# Patient Record
Sex: Female | Born: 1970 | Race: Asian | Hispanic: No | Marital: Married | Smoking: Never smoker
Health system: Southern US, Community
[De-identification: ages and names within clinical notes are randomized; demographics above are authoritative.]

## PROBLEM LIST (undated history)

## (undated) ENCOUNTER — Inpatient Hospital Stay (HOSPITAL_COMMUNITY): Payer: Self-pay

## (undated) DIAGNOSIS — Z789 Other specified health status: Secondary | ICD-10-CM

## (undated) HISTORY — PX: NO PAST SURGERIES: SHX2092

---

## 2004-11-23 ENCOUNTER — Inpatient Hospital Stay (HOSPITAL_COMMUNITY): Admission: AD | Admit: 2004-11-23 | Discharge: 2004-11-23 | Payer: Self-pay | Admitting: Obstetrics

## 2004-11-23 ENCOUNTER — Encounter (INDEPENDENT_AMBULATORY_CARE_PROVIDER_SITE_OTHER): Payer: Self-pay | Admitting: Specialist

## 2005-03-08 ENCOUNTER — Ambulatory Visit (HOSPITAL_COMMUNITY): Admission: RE | Admit: 2005-03-08 | Discharge: 2005-03-08 | Payer: Self-pay | Admitting: Obstetrics

## 2005-03-09 ENCOUNTER — Ambulatory Visit (HOSPITAL_COMMUNITY): Admission: RE | Admit: 2005-03-09 | Discharge: 2005-03-09 | Payer: Self-pay | Admitting: Obstetrics

## 2005-03-09 ENCOUNTER — Encounter (INDEPENDENT_AMBULATORY_CARE_PROVIDER_SITE_OTHER): Payer: Self-pay | Admitting: *Deleted

## 2005-03-28 ENCOUNTER — Ambulatory Visit (HOSPITAL_COMMUNITY): Admission: RE | Admit: 2005-03-28 | Discharge: 2005-03-28 | Payer: Self-pay | Admitting: Obstetrics

## 2005-05-07 ENCOUNTER — Inpatient Hospital Stay (HOSPITAL_COMMUNITY): Admission: AD | Admit: 2005-05-07 | Discharge: 2005-05-07 | Payer: Self-pay | Admitting: Obstetrics

## 2006-01-15 ENCOUNTER — Inpatient Hospital Stay (HOSPITAL_COMMUNITY): Admission: AD | Admit: 2006-01-15 | Discharge: 2006-01-17 | Payer: Self-pay | Admitting: Obstetrics

## 2007-02-11 ENCOUNTER — Ambulatory Visit: Payer: Self-pay | Admitting: Family Medicine

## 2007-02-11 LAB — CONVERTED CEMR LAB
Basophils Absolute: 0.1 10*3/uL (ref 0.0–0.1)
Basophils Relative: 0.9 % (ref 0.0–1.0)
Glucose, Bld: 108 mg/dL — ABNORMAL HIGH (ref 70–99)
LDL Cholesterol: 100 mg/dL — ABNORMAL HIGH (ref 0–99)
Lymphocytes Relative: 26.5 % (ref 12.0–46.0)
MCHC: 33.4 g/dL (ref 30.0–36.0)
Monocytes Absolute: 0.6 10*3/uL (ref 0.2–0.7)
Neutro Abs: 6.8 10*3/uL (ref 1.4–7.7)
RBC: 4.69 M/uL (ref 3.87–5.11)
RDW: 12.1 % (ref 11.5–14.6)
Triglycerides: 139 mg/dL (ref 0–149)
WBC: 10.4 10*3/uL (ref 4.5–10.5)

## 2007-08-21 ENCOUNTER — Emergency Department (HOSPITAL_COMMUNITY): Admission: EM | Admit: 2007-08-21 | Discharge: 2007-08-21 | Payer: Self-pay | Admitting: Emergency Medicine

## 2010-11-13 ENCOUNTER — Encounter: Payer: Self-pay | Admitting: Obstetrics

## 2011-03-10 NOTE — Op Note (Signed)
NAMEJILIAN, WEST NO.:  [id number]   MEDICAL RECORD NO.:  [id number]           PATIENT TYPE:   LOCATION:                                 FACILITY:   PHYSICIAN:  Kathreen Cosier, M.D.   DATE OF BIRTH:   DATE OF PROCEDURE:  03/09/2005  DATE OF DISCHARGE:                                 OPERATIVE REPORT   PREOPERATIVE DIAGNOSIS:  Blighted ovum.   Using MAC, patient in lithotomy position, perineum and vagina were prepped  and draped, bladder emptied with a straight catheter.  Bimanual exam  revealed the uterus to be eight week size.  A weighted speculum placed in  the vagina, anterior lip of the cervix grasped with a tenaculum.  Xylocaine  1% 9 mL injected at 3 and 9 o'clock.  The cervix was open.  The cavity was  sounded at 10 cm.  The cervix easily admitted a #9 suction, which was used  to aspirate the uterine contents.  The procedure was done until the cavity  was cleaned.  The patient tolerated the procedure well, taken to the  recovery room in good condition.      BAM/MEDQ  D:  03/09/2005  T:  03/09/2005  Job:  [id number]

## 2011-05-24 ENCOUNTER — Other Ambulatory Visit (HOSPITAL_COMMUNITY): Payer: Self-pay | Admitting: Obstetrics

## 2011-05-24 DIAGNOSIS — Z1231 Encounter for screening mammogram for malignant neoplasm of breast: Secondary | ICD-10-CM

## 2011-06-22 ENCOUNTER — Ambulatory Visit (HOSPITAL_COMMUNITY)
Admission: RE | Admit: 2011-06-22 | Discharge: 2011-06-22 | Disposition: A | Discharge status: Home / Self Care | Payer: 59 | Source: Ambulatory Visit | Attending: Obstetrics | Admitting: Obstetrics

## 2011-06-22 DIAGNOSIS — Z1231 Encounter for screening mammogram for malignant neoplasm of breast: Secondary | ICD-10-CM | POA: Insufficient documentation

## 2011-06-30 ENCOUNTER — Other Ambulatory Visit: Payer: Self-pay | Admitting: Obstetrics

## 2011-06-30 DIAGNOSIS — R928 Other abnormal and inconclusive findings on diagnostic imaging of breast: Secondary | ICD-10-CM

## 2011-07-10 ENCOUNTER — Ambulatory Visit
Admission: RE | Admit: 2011-07-10 | Discharge: 2011-07-10 | Disposition: A | Discharge status: Home / Self Care | Payer: 59 | Source: Ambulatory Visit | Attending: Obstetrics | Admitting: Obstetrics

## 2011-07-10 ENCOUNTER — Other Ambulatory Visit: Payer: Self-pay | Admitting: Obstetrics

## 2011-07-10 DIAGNOSIS — R928 Other abnormal and inconclusive findings on diagnostic imaging of breast: Secondary | ICD-10-CM

## 2011-07-17 ENCOUNTER — Ambulatory Visit
Admission: RE | Admit: 2011-07-17 | Discharge: 2011-07-17 | Disposition: A | Discharge status: Home / Self Care | Payer: 59 | Source: Ambulatory Visit | Attending: Obstetrics | Admitting: Obstetrics

## 2011-07-17 DIAGNOSIS — R928 Other abnormal and inconclusive findings on diagnostic imaging of breast: Secondary | ICD-10-CM

## 2012-05-13 ENCOUNTER — Other Ambulatory Visit: Payer: Self-pay | Admitting: Obstetrics

## 2012-05-13 DIAGNOSIS — N63 Unspecified lump in unspecified breast: Secondary | ICD-10-CM

## 2012-05-28 ENCOUNTER — Ambulatory Visit
Admission: RE | Admit: 2012-05-28 | Discharge: 2012-05-28 | Disposition: A | Discharge status: Home / Self Care | Payer: 59 | Source: Ambulatory Visit | Attending: Obstetrics | Admitting: Obstetrics

## 2012-05-28 DIAGNOSIS — N63 Unspecified lump in unspecified breast: Secondary | ICD-10-CM

## 2013-06-26 ENCOUNTER — Encounter (HOSPITAL_COMMUNITY): Payer: Self-pay | Admitting: Emergency Medicine

## 2013-06-26 ENCOUNTER — Emergency Department (HOSPITAL_COMMUNITY)
Admission: EM | Admit: 2013-06-26 | Discharge: 2013-06-26 | Disposition: A | Discharge status: Home / Self Care | Payer: 59 | Source: Home / Self Care

## 2013-06-26 DIAGNOSIS — I889 Nonspecific lymphadenitis, unspecified: Secondary | ICD-10-CM

## 2013-06-26 MED ORDER — AMOXICILLIN-POT CLAVULANATE 875-125 MG PO TABS: 1.0000 | ORAL_TABLET | Freq: Two times a day (BID) | ORAL | Status: DC

## 2013-06-26 NOTE — ED Notes (Signed)
Pt c/o swollen lymph node onset Tuesday Sxs include: pain when she eats Denies: fevers, cold sxs  Alert w/no signs of acute distress.

## 2013-06-26 NOTE — Discharge Instructions (Signed)
Cervical Adenitis °You have a swollen lymph gland in your neck. This commonly happens with Strep and virus infections, dental problems, insect bites, and injuries about the face, scalp, or neck. The lymph glands swell as the body fights the infection or heals the injury. Swelling and firmness typically lasts for several weeks after the infection or injury is healed. Rarely lymph glands can become swollen because of cancer or TB. °Antibiotics are prescribed if there is evidence of an infection. Sometimes an infected lymph gland becomes filled with pus. This condition may require opening up the abscessed gland by draining it surgically. Most of the time infected glands return to normal within two weeks. Do not poke or squeeze the swollen lymph nodes. That may keep them from shrinking back to their normal size. If the lymph gland is still swollen after 2 weeks, further medical evaluation is needed.  °SEEK IMMEDIATE MEDICAL CARE IF:  °You have difficulty swallowing or breathing, increased swelling, severe pain, or a high fever.  °Document Released: 10/09/2005 Document Revised: 01/01/2012 Document Reviewed: 03/31/2007 °ExitCare® Patient Information ©2014 [employer]. ° °Swollen Lymph Nodes °The lymphatic system filters fluid from around cells. It is like a system of blood vessels. These channels carry lymph instead of blood. The lymphatic system is an important part of the immune (disease fighting) system. When people talk about "swollen glands in the neck," they are usually talking about swollen lymph nodes. The lymph nodes are like the little traps for infection. You and your caregiver may be able to feel lymph nodes, especially swollen nodes, in these common areas: the groin (inguinal area), armpits (axilla), and above the clavicle (supraclavicular). You may also feel them in the neck (cervical) and the back of the head just above the hairline (occipital). °Swollen glands occur when there is any condition in which  the body responds with an allergic type of reaction. For instance, the glands in the neck can become swollen from insect bites or any type of minor infection on the head. These are very noticeable in children with only minor problems. Lymph nodes may also become swollen when there is a tumor or problem with the lymphatic system, such as Hodgkin's disease. °TREATMENT  °· Most swollen glands do not require treatment. They can be observed (watched) for a short period of time, if your caregiver feels it is necessary. Most of the time, observation is not necessary. °· Antibiotics (medicines that kill germs) may be prescribed by your caregiver. Your caregiver may prescribe these if he or she feels the swollen glands are due to a bacterial (germ) infection. Antibiotics are not used if the swollen glands are caused by a virus. °HOME CARE INSTRUCTIONS  °· Take medications as directed by your caregiver. Only take over-the-counter or prescription medicines for pain, discomfort, or fever as directed by your caregiver. °SEEK MEDICAL CARE IF:  °· If you begin to run a temperature greater than 102° F (38.9° C), or as your caregiver suggests. °MAKE SURE YOU:  °· Understand these instructions. °· Will watch your condition. °· Will get help right away if you are not doing well or get worse. °Document Released: 09/29/2002 Document Revised: 01/01/2012 Document Reviewed: 10/09/2005 °ExitCare® Patient Information ©2014 [employer]. ° °

## 2013-06-26 NOTE — ED Provider Notes (Signed)
CSN: [id number]     Arrival date & time 06/26/13  0831 History   None    Chief Complaint  Patient presents with  . Lymphadenopathy   (Consider location/radiation/quality/duration/timing/severity/associated sxs/prior Treatment) HPI Comments: Healthy 42 year old female presents with a swelling nodule to her right anterior neck. It began about 48 hours ago it started out as a small nodule of approximately 1 cm. For the past 48 hours it has continued to enlarge. Patient denies local or systemic symptoms. Described mild pain over the nodule when opening mouth widely. Denies problems with swallowing.   History reviewed. No pertinent past medical history. History reviewed. No pertinent past surgical history. No family history on file. History  Substance Use Topics  . Smoking status: Never Smoker   . Smokeless tobacco: Not on file  . Alcohol Use: No   OB History   Grav Para Term Preterm Abortions TAB SAB Ect Mult Living                 Review of Systems  Constitutional: Negative for fever, chills, activity change, appetite change, fatigue and unexpected weight change.  HENT: Negative for ear pain, congestion, sore throat, facial swelling, rhinorrhea, neck pain, postnasal drip, sinus pressure, tinnitus and ear discharge.   Respiratory: Negative.   Cardiovascular: Negative.   Gastrointestinal: Negative.   Neurological: Negative.   Psychiatric/Behavioral: Negative.     Allergies  Review of patient's allergies indicates no known allergies.  Home Medications   Current Outpatient Rx  Name  Route  Sig  Dispense  Refill  . amoxicillin-clavulanate (AUGMENTIN) 875-125 MG per tablet   Oral   Take 1 tablet by mouth every 12 (twelve) hours.   14 tablet   0    BP 100/68  Pulse 81  Temp(Src) 98.5 F (36.9 C) (Oral)  Resp 16  SpO2 98%  LMP 06/25/2013 Physical Exam  Vitals reviewed. Constitutional: She is oriented to person, place, and time. She appears well-developed and  well-nourished. No distress.  Patient is sitting on the table with relaxed posturing, smiling, speaking in complete sentences and in no  distress. She continues to deny constitutional symptoms or feeling ill in any way.  HENT:  Right Ear: External ear normal.  Left Ear: External ear normal.  Oropharynx with minor erythema and one solitary white patch to the left tonsil. This could represent a food particle. No other signs of exudate or intraoral swelling. No dental tenderness. No buccal or tongue or other intraoral lesions. No swelling within the mouth. No tenderness to palpation. Swallow reflex is normal she is able to open her mouth widely and normally and move her mandible side to side normally.  Eyes: Conjunctivae and EOM are normal.  Neck: Normal range of motion. Neck supple.  There is a solitary right cervical enlarge lymph node immediately inferior to the mandible. Mildly tender and slightly mobile. No overlying discoloration. No swelling. The lesion is well marginated and measures 3 x 2 cm. No other lymph nodes are palpable or observed.  Cardiovascular: Normal rate, regular rhythm and normal heart sounds.   Pulmonary/Chest: Effort normal and breath sounds normal. No respiratory distress. She has no wheezes.  Musculoskeletal: She exhibits no edema.  Lymphadenopathy:    She has cervical adenopathy.  Neurological: She is alert and oriented to person, place, and time.  Skin: Skin is warm and dry.  Psychiatric: She has a normal mood and affect.    ED Course  Procedures (including critical care time) Labs Review Labs Reviewed  POCT RAPID STREP A ([facility] CARE ONLY)   Imaging Review No results found.  MDM   1. Cervical lymphadenitis        Augmentin 875 mg twice a day for 7 days. For discomfort may take Tylenol.  If the lymph node is getting worse, larger developing erythema or additional enlarged tender nodes seek medical attention promptly. Apply warm compresses. Am unable to  locate the source for the enlarged lymph node. No localized infection is appreciated  Unless there is a subtle occult infection of the pharynx. Rapid strep is negative will be sent down for culture.  Hayden Rasmussen, NP 06/26/13 [phone]  Medical screening examination/treatment/procedure(s) were performed by a resident physician or non-physician practitioner and as the supervising physician I was immediately available for consultation/collaboration.  Clementeen Graham, MD   Rodolph Bong, MD 06/26/13 [phone]

## 2013-06-28 LAB — CULTURE, GROUP A STREP

## 2013-07-18 ENCOUNTER — Other Ambulatory Visit: Payer: Self-pay | Admitting: Obstetrics

## 2013-07-18 DIAGNOSIS — D242 Benign neoplasm of left breast: Secondary | ICD-10-CM

## 2013-08-13 ENCOUNTER — Ambulatory Visit
Admission: RE | Admit: 2013-08-13 | Discharge: 2013-08-13 | Disposition: A | Discharge status: Home / Self Care | Payer: PRIVATE HEALTH INSURANCE | Source: Ambulatory Visit | Attending: Obstetrics | Admitting: Obstetrics

## 2013-08-13 DIAGNOSIS — D242 Benign neoplasm of left breast: Secondary | ICD-10-CM

## 2014-05-20 LAB — OB RESULTS CONSOLE ANTIBODY SCREEN: Antibody Screen: NEGATIVE

## 2014-05-20 LAB — OB RESULTS CONSOLE GC/CHLAMYDIA
Chlamydia: NEGATIVE
Gonorrhea: NEGATIVE

## 2014-05-20 LAB — OB RESULTS CONSOLE HIV ANTIBODY (ROUTINE TESTING): HIV: NONREACTIVE

## 2014-05-20 LAB — OB RESULTS CONSOLE RPR: RPR: NONREACTIVE

## 2014-05-20 LAB — OB RESULTS CONSOLE HEPATITIS B SURFACE ANTIGEN: Hepatitis B Surface Ag: NEGATIVE

## 2014-05-20 LAB — OB RESULTS CONSOLE RUBELLA ANTIBODY, IGM: Rubella: IMMUNE

## 2014-05-20 LAB — OB RESULTS CONSOLE ABO/RH: RH Type: POSITIVE

## 2014-06-30 ENCOUNTER — Other Ambulatory Visit (HOSPITAL_COMMUNITY): Payer: Self-pay | Admitting: Obstetrics

## 2014-06-30 DIAGNOSIS — O09529 Supervision of elderly multigravida, unspecified trimester: Secondary | ICD-10-CM

## 2014-07-22 ENCOUNTER — Encounter (HOSPITAL_COMMUNITY): Payer: Self-pay

## 2014-07-22 ENCOUNTER — Ambulatory Visit (HOSPITAL_COMMUNITY)
Admission: RE | Admit: 2014-07-22 | Discharge: 2014-07-22 | Disposition: A | Discharge status: Home / Self Care | Payer: 59 | Source: Ambulatory Visit | Attending: Obstetrics | Admitting: Obstetrics

## 2014-07-22 ENCOUNTER — Ambulatory Visit (HOSPITAL_COMMUNITY)
Admission: RE | Admit: 2014-07-22 | Discharge: 2014-07-22 | Disposition: A | Discharge status: Home / Self Care | Payer: Self-pay | Source: Ambulatory Visit | Attending: Obstetrics | Admitting: Obstetrics

## 2014-07-22 DIAGNOSIS — O09529 Supervision of elderly multigravida, unspecified trimester: Secondary | ICD-10-CM | POA: Insufficient documentation

## 2014-07-22 DIAGNOSIS — O09522 Supervision of elderly multigravida, second trimester: Secondary | ICD-10-CM

## 2014-07-22 DIAGNOSIS — IMO0002 Reserved for concepts with insufficient information to code with codable children: Secondary | ICD-10-CM | POA: Insufficient documentation

## 2014-07-22 NOTE — Progress Notes (Signed)
Appointment Date: 07/22/2014 DOB: 01-Feb-1971 Referring Provider: Frederico Hamman, MD Attending: Dr. Benjaman Lobe  Elizabeth Hamilton and her husband, Elizabeth Hamilton, were seen for genetic counseling because of a maternal age of 43 y.o.Marland Kitchen     They were counseled regarding maternal age and the association with risk for chromosome conditions due to nondisjunction.   We reviewed chromosomes, nondisjunction, and the associated 1 in 16 risk for fetal aneuploidy related to a maternal age of 42 y.o. at [redacted]w[redacted]d gestation.  They were counseled that the risk for aneuploidy decreases as gestational age increases, accounting for those pregnancies which spontaneously abort.  We specifically discussed Down syndrome (trisomy 88), trisomies 45 and 76, and sex chromosome aneuploidies (47,XXX and 47,XXY) including the common features and prognoses of each.   We reviewed available screening options including Quad screen, noninvasive prenatal screening (NIPS)/cell free DNA (cfDNA) testing, and detailed ultrasound.  They were counseled that screening tests are used to modify a patient's a priori risk for aneuploidy, typically based on age. This estimate provides a pregnancy specific risk assessment. We reviewed the benefits and limitations of each option. Specifically, we discussed the conditions for which each test screens, the detection rates, and false positive rates of each. They were also counseled regarding diagnostic testing via amniocentesis. We reviewed the approximate 1 in 017-494 risk for complications for amniocentesis, including spontaneous pregnancy loss. After consideration of all the options, they elected to proceed with NIPS.  However, Ms. Roselli was notified during her visit that her insurance was no longer active.  Thus, she no longer had insurance coverage for the pregnancy.  For this reason, she requested to delay having NIPS until she returns for ultrasound in 3 weeks.  She will attempt to straighten out  her insurance situation, but was also given information to contact the laboratory directly to arrange for "compassionate care pricing".     A complete ultrasound was performed today. The ultrasound report will be sent under separate cover. There were no visualized fetal anomalies or markers suggestive of aneuploidy, other than choroid plexus cysts. Diagnostic testing and screening were declined today.  They understand that ultrasound cannot rule out all birth defects or genetic syndromes. The patient was advised of this limitation and states she still does not want additional testing at this time.   [name] was provided with written information regarding cystic fibrosis (CF) including the carrier frequency and incidence in the Caucasian population, the availability of carrier testing and prenatal diagnosis if indicated.  In addition, we discussed that CF is routinely screened for as part of the [facility] newborn screening panel.  She declined testing today.   Both family histories were reviewed and found to be noncontributory for birth defects, intellectual disability, and known genetic conditions. Without further information regarding the provided family history, an accurate genetic risk cannot be calculated. Further genetic counseling is warranted if more information is obtained.  [name] denied exposure to environmental toxins or chemical agents. She denied the use of alcohol, tobacco or street drugs. She denied significant viral illnesses during the course of her pregnancy. Her medical and surgical histories were noncontributory.   I counseled this couple regarding the above risks and available options.  The approximate face-to-face time with the genetic counselor was 45 minutes.  Cam Hai, MS,  Certified Genetic Counselor

## 2014-08-13 ENCOUNTER — Other Ambulatory Visit (HOSPITAL_COMMUNITY): Payer: Self-pay | Admitting: Maternal and Fetal Medicine

## 2014-08-13 DIAGNOSIS — O09292 Supervision of pregnancy with other poor reproductive or obstetric history, second trimester: Secondary | ICD-10-CM

## 2014-08-13 DIAGNOSIS — Z3689 Encounter for other specified antenatal screening: Secondary | ICD-10-CM

## 2014-08-13 DIAGNOSIS — O09522 Supervision of elderly multigravida, second trimester: Secondary | ICD-10-CM

## 2014-08-13 DIAGNOSIS — Z8632 Personal history of gestational diabetes: Secondary | ICD-10-CM

## 2014-08-18 ENCOUNTER — Encounter (HOSPITAL_COMMUNITY): Payer: Self-pay

## 2014-08-18 ENCOUNTER — Ambulatory Visit (HOSPITAL_COMMUNITY)
Admission: RE | Admit: 2014-08-18 | Discharge: 2014-08-18 | Disposition: A | Discharge status: Home / Self Care | Payer: 59 | Source: Ambulatory Visit | Attending: Obstetrics | Admitting: Obstetrics

## 2014-08-18 VITALS — BP 113/47 | HR 81 | Wt 151.0 lb

## 2014-08-18 DIAGNOSIS — Z3689 Encounter for other specified antenatal screening: Secondary | ICD-10-CM

## 2014-08-18 DIAGNOSIS — O350XX0 Maternal care for (suspected) central nervous system malformation in fetus, not applicable or unspecified: Secondary | ICD-10-CM

## 2014-08-18 DIAGNOSIS — Z3A20 20 weeks gestation of pregnancy: Secondary | ICD-10-CM | POA: Insufficient documentation

## 2014-08-18 DIAGNOSIS — O3503X0 Maternal care for (suspected) central nervous system malformation or damage in fetus, choroid plexus cysts, not applicable or unspecified: Secondary | ICD-10-CM

## 2014-08-18 DIAGNOSIS — O09522 Supervision of elderly multigravida, second trimester: Secondary | ICD-10-CM | POA: Insufficient documentation

## 2014-08-18 DIAGNOSIS — Z0489 Encounter for examination and observation for other specified reasons: Secondary | ICD-10-CM

## 2014-08-18 DIAGNOSIS — O09292 Supervision of pregnancy with other poor reproductive or obstetric history, second trimester: Secondary | ICD-10-CM

## 2014-08-18 DIAGNOSIS — Z36 Encounter for antenatal screening of mother: Secondary | ICD-10-CM | POA: Diagnosis not present

## 2014-08-18 DIAGNOSIS — Z8632 Personal history of gestational diabetes: Secondary | ICD-10-CM

## 2014-08-18 DIAGNOSIS — IMO0002 Reserved for concepts with insufficient information to code with codable children: Secondary | ICD-10-CM

## 2014-08-20 ENCOUNTER — Other Ambulatory Visit (HOSPITAL_COMMUNITY): Payer: Self-pay | Admitting: Maternal and Fetal Medicine

## 2014-08-20 ENCOUNTER — Other Ambulatory Visit (HOSPITAL_COMMUNITY): Payer: Self-pay

## 2014-08-20 DIAGNOSIS — O09292 Supervision of pregnancy with other poor reproductive or obstetric history, second trimester: Secondary | ICD-10-CM

## 2014-08-20 DIAGNOSIS — O09522 Supervision of elderly multigravida, second trimester: Secondary | ICD-10-CM

## 2014-08-24 ENCOUNTER — Encounter (HOSPITAL_COMMUNITY): Payer: Self-pay

## 2014-08-25 ENCOUNTER — Telehealth (HOSPITAL_COMMUNITY): Payer: Self-pay | Admitting: MS"

## 2014-08-25 NOTE — Telephone Encounter (Signed)
Called [facility] to discuss her cell free fetal DNA test results.  Mrs. Elizabeth Hamilton had Panorama testing through [facility].  Testing was offered because of advanced maternal age.   The patient was identified by name and DOB.  We reviewed that these are within normal limits, showing a less than 1 in 10,000 risk for trisomies 21, 18 and 13, and monosomy X (Turner syndrome).  In addition, the risk for triploidy/vanishing twin and sex chromosome trisomies (47,XXX and 47,XXY) was also low risk.  We reviewed that this testing identifies > 99% of pregnancies with trisomy 33, trisomy 107, sex chromosome trisomies (47,XXX and 47,XXY), and triploidy. The detection rate for trisomy 18 is 96%.  The detection rate for monosomy X is ~92%.  The false positive rate is <0.1% for all conditions. Testing was also consistent with female fetal sex.  The patient did wish to know fetal sex.  She understands that this testing does not identify all genetic conditions.  All questions were answered to her satisfaction, she was encouraged to call with additional questions or concerns.  Chipper Oman, MS Certified Genetic Counselor 08/25/2014 2:24 PM

## 2014-10-13 ENCOUNTER — Ambulatory Visit (HOSPITAL_COMMUNITY): Payer: 59

## 2014-10-21 ENCOUNTER — Encounter (HOSPITAL_COMMUNITY): Payer: Self-pay

## 2014-10-21 ENCOUNTER — Ambulatory Visit (HOSPITAL_COMMUNITY)
Admission: RE | Admit: 2014-10-21 | Discharge: 2014-10-21 | Disposition: A | Discharge status: Home / Self Care | Payer: 59 | Source: Ambulatory Visit | Attending: Maternal and Fetal Medicine | Admitting: Maternal and Fetal Medicine

## 2014-10-21 ENCOUNTER — Other Ambulatory Visit (HOSPITAL_COMMUNITY): Payer: Self-pay | Admitting: Maternal and Fetal Medicine

## 2014-10-21 DIAGNOSIS — O09522 Supervision of elderly multigravida, second trimester: Secondary | ICD-10-CM

## 2014-10-21 DIAGNOSIS — O09523 Supervision of elderly multigravida, third trimester: Secondary | ICD-10-CM | POA: Insufficient documentation

## 2014-10-21 DIAGNOSIS — O09292 Supervision of pregnancy with other poor reproductive or obstetric history, second trimester: Secondary | ICD-10-CM

## 2014-10-21 DIAGNOSIS — Z3A29 29 weeks gestation of pregnancy: Secondary | ICD-10-CM | POA: Insufficient documentation

## 2014-10-21 DIAGNOSIS — Z36 Encounter for antenatal screening of mother: Secondary | ICD-10-CM | POA: Diagnosis not present

## 2014-10-21 DIAGNOSIS — O09529 Supervision of elderly multigravida, unspecified trimester: Secondary | ICD-10-CM | POA: Insufficient documentation

## 2014-10-21 DIAGNOSIS — O09293 Supervision of pregnancy with other poor reproductive or obstetric history, third trimester: Secondary | ICD-10-CM | POA: Diagnosis not present

## 2014-10-23 NOTE — L&D Delivery Note (Signed)
Delivery Note At 2:44 AM a viable female was delivered via Vaginal, Spontaneous Delivery (Presentation: ;  ).  APGAR: , ; weight  .   Placenta status: Intact, Spontaneous.  Cord: 3 vessels with the following complications: None.  Cord pH: not done  Anesthesia: Epidural  Episiotomy: None Lacerations: None Suture Repair: 2.0 Est. Blood Loss (mL): 250  Mom to postpartum.  Baby to Couplet care / Skin to Skin.  Jolynda Townley A 12/26/2014, 2:56 AM

## 2014-11-10 ENCOUNTER — Encounter (HOSPITAL_COMMUNITY): Payer: Self-pay

## 2014-12-01 ENCOUNTER — Ambulatory Visit (HOSPITAL_COMMUNITY)
Admission: RE | Admit: 2014-12-01 | Discharge: 2014-12-01 | Disposition: A | Discharge status: Home / Self Care | Payer: 59 | Source: Ambulatory Visit | Attending: Obstetrics | Admitting: Obstetrics

## 2014-12-01 ENCOUNTER — Encounter (HOSPITAL_COMMUNITY): Payer: Self-pay

## 2014-12-01 ENCOUNTER — Other Ambulatory Visit (HOSPITAL_COMMUNITY): Payer: Self-pay | Admitting: Maternal and Fetal Medicine

## 2014-12-01 DIAGNOSIS — O09293 Supervision of pregnancy with other poor reproductive or obstetric history, third trimester: Secondary | ICD-10-CM | POA: Diagnosis not present

## 2014-12-01 DIAGNOSIS — Z3A35 35 weeks gestation of pregnancy: Secondary | ICD-10-CM | POA: Insufficient documentation

## 2014-12-01 DIAGNOSIS — O09523 Supervision of elderly multigravida, third trimester: Secondary | ICD-10-CM | POA: Insufficient documentation

## 2014-12-01 DIAGNOSIS — O09292 Supervision of pregnancy with other poor reproductive or obstetric history, second trimester: Secondary | ICD-10-CM

## 2014-12-01 DIAGNOSIS — O09522 Supervision of elderly multigravida, second trimester: Secondary | ICD-10-CM

## 2014-12-01 DIAGNOSIS — O09529 Supervision of elderly multigravida, unspecified trimester: Secondary | ICD-10-CM | POA: Insufficient documentation

## 2014-12-04 ENCOUNTER — Encounter (HOSPITAL_COMMUNITY): Payer: Self-pay

## 2014-12-04 ENCOUNTER — Ambulatory Visit (HOSPITAL_COMMUNITY)
Admission: RE | Admit: 2014-12-04 | Discharge: 2014-12-04 | Disposition: A | Discharge status: Home / Self Care | Payer: 59 | Source: Ambulatory Visit | Attending: Obstetrics | Admitting: Obstetrics

## 2014-12-04 DIAGNOSIS — O09513 Supervision of elderly primigravida, third trimester: Secondary | ICD-10-CM | POA: Insufficient documentation

## 2014-12-04 DIAGNOSIS — O09523 Supervision of elderly multigravida, third trimester: Secondary | ICD-10-CM | POA: Diagnosis present

## 2014-12-04 DIAGNOSIS — Z3A35 35 weeks gestation of pregnancy: Secondary | ICD-10-CM | POA: Diagnosis not present

## 2014-12-04 DIAGNOSIS — O09293 Supervision of pregnancy with other poor reproductive or obstetric history, third trimester: Secondary | ICD-10-CM | POA: Diagnosis not present

## 2014-12-04 HISTORY — DX: Other specified health status: Z78.9

## 2014-12-08 ENCOUNTER — Ambulatory Visit (HOSPITAL_COMMUNITY)
Admission: RE | Admit: 2014-12-08 | Discharge: 2014-12-08 | Disposition: A | Discharge status: Home / Self Care | Payer: 59 | Source: Ambulatory Visit | Attending: Obstetrics | Admitting: Obstetrics

## 2014-12-08 ENCOUNTER — Encounter (HOSPITAL_COMMUNITY): Payer: Self-pay

## 2014-12-08 DIAGNOSIS — O09523 Supervision of elderly multigravida, third trimester: Secondary | ICD-10-CM | POA: Insufficient documentation

## 2014-12-08 DIAGNOSIS — Z3A36 36 weeks gestation of pregnancy: Secondary | ICD-10-CM | POA: Diagnosis not present

## 2014-12-08 DIAGNOSIS — O09293 Supervision of pregnancy with other poor reproductive or obstetric history, third trimester: Secondary | ICD-10-CM | POA: Insufficient documentation

## 2014-12-11 ENCOUNTER — Inpatient Hospital Stay (HOSPITAL_COMMUNITY)
Admission: AD | Admit: 2014-12-11 | Discharge: 2014-12-11 | Disposition: A | Discharge status: Home / Self Care | Payer: 59 | Source: Ambulatory Visit | Attending: Obstetrics | Admitting: Obstetrics

## 2014-12-11 ENCOUNTER — Encounter (HOSPITAL_COMMUNITY): Payer: Self-pay | Admitting: *Deleted

## 2014-12-11 ENCOUNTER — Encounter (HOSPITAL_COMMUNITY): Payer: Self-pay

## 2014-12-11 ENCOUNTER — Ambulatory Visit (HOSPITAL_COMMUNITY)
Admission: RE | Admit: 2014-12-11 | Discharge: 2014-12-11 | Disposition: A | Discharge status: Home / Self Care | Payer: 59 | Source: Ambulatory Visit | Attending: Obstetrics | Admitting: Obstetrics

## 2014-12-11 DIAGNOSIS — O09293 Supervision of pregnancy with other poor reproductive or obstetric history, third trimester: Secondary | ICD-10-CM | POA: Diagnosis not present

## 2014-12-11 DIAGNOSIS — O09523 Supervision of elderly multigravida, third trimester: Secondary | ICD-10-CM | POA: Insufficient documentation

## 2014-12-11 DIAGNOSIS — Z3A36 36 weeks gestation of pregnancy: Secondary | ICD-10-CM | POA: Insufficient documentation

## 2014-12-11 NOTE — MAU Note (Signed)
Pt started leaking fluid at 0130. Has changed pads twice since with fluid. Abdominal pressure noted.

## 2014-12-11 NOTE — MAU Note (Addendum)
SAYS   SHE  THINKS  SROM   AT 0130.     PNC   WITH  DR  MARSHALL-    NO VE - SAYS   LOW FLUID AND  BREECH-      BUT Tuesday  FLUID  WAS NL  DENIES HSV AND MRSA.    SAYS   LAST  SEX-  LAST  NIGHT

## 2014-12-11 NOTE — MAU Provider Note (Signed)
Ms. Elizabeth Hamilton is a 44 y.o. A1P3790 at [redacted]w[redacted]d  who presents to MAU today complaining contractions and LOF since 0130 today. The patient denies vaginal bleeding or complications with the pregnancy. She states contractions are moderate and frequent. She reports good fetal movement. Last intercourse was last night.   BP 114/66 mmHg  Pulse 97  Temp(Src) 98 F (36.7 C) (Oral)  Resp 24  LMP 03/15/2014 GENERAL: Well-developed, well-nourished female in no acute distress.  HEENT: Normocephalic, atraumatic.  LUNGS: Normal effort HEART: Regular rate  ABDOMEN: Soft, nontender, nondistended.  PELVIC: Normal external female genitalia. Vagina is pink and rugated.  Moderate amount of thin, yellow-white discharge.  negative pooling. Gravid uterus.   EXTREMITIES: No cyanosis, clubbing, or edema Dilation: Fingertip Exam by:: DCALLAWAY, RN  Maryann Alar - negative Multiple sperm present  Fetal Monitoring:  Baseline: 125 bpm, moderate variability, + accelerations, no decelerations Contractions: q 3-5 minutes  A: Membranes intact  P: Report given to RN to contact MD on call for further instructions  Luvenia Redden, PA-C 12/11/2014 5:02 AM

## 2014-12-15 ENCOUNTER — Ambulatory Visit (HOSPITAL_COMMUNITY)
Admission: RE | Admit: 2014-12-15 | Discharge: 2014-12-15 | Disposition: A | Discharge status: Home / Self Care | Payer: 59 | Source: Ambulatory Visit | Attending: Obstetrics | Admitting: Obstetrics

## 2014-12-15 ENCOUNTER — Other Ambulatory Visit (HOSPITAL_COMMUNITY): Payer: Self-pay | Admitting: Maternal and Fetal Medicine

## 2014-12-15 ENCOUNTER — Encounter (HOSPITAL_COMMUNITY): Payer: Self-pay

## 2014-12-15 DIAGNOSIS — O09523 Supervision of elderly multigravida, third trimester: Secondary | ICD-10-CM

## 2014-12-15 DIAGNOSIS — O43123 Velamentous insertion of umbilical cord, third trimester: Secondary | ICD-10-CM | POA: Diagnosis not present

## 2014-12-15 DIAGNOSIS — Z3A37 37 weeks gestation of pregnancy: Secondary | ICD-10-CM | POA: Insufficient documentation

## 2014-12-15 DIAGNOSIS — Z36 Encounter for antenatal screening of mother: Secondary | ICD-10-CM | POA: Insufficient documentation

## 2014-12-15 DIAGNOSIS — O09293 Supervision of pregnancy with other poor reproductive or obstetric history, third trimester: Secondary | ICD-10-CM

## 2014-12-18 ENCOUNTER — Ambulatory Visit (HOSPITAL_COMMUNITY)
Admission: RE | Admit: 2014-12-18 | Discharge: 2014-12-18 | Disposition: A | Discharge status: Home / Self Care | Payer: 59 | Source: Ambulatory Visit | Attending: Obstetrics | Admitting: Obstetrics

## 2014-12-18 ENCOUNTER — Encounter (HOSPITAL_COMMUNITY): Payer: Self-pay

## 2014-12-18 DIAGNOSIS — O09293 Supervision of pregnancy with other poor reproductive or obstetric history, third trimester: Secondary | ICD-10-CM | POA: Diagnosis not present

## 2014-12-18 DIAGNOSIS — O09523 Supervision of elderly multigravida, third trimester: Secondary | ICD-10-CM | POA: Diagnosis not present

## 2014-12-18 DIAGNOSIS — Z36 Encounter for antenatal screening of mother: Secondary | ICD-10-CM | POA: Insufficient documentation

## 2014-12-18 DIAGNOSIS — Z3A37 37 weeks gestation of pregnancy: Secondary | ICD-10-CM | POA: Diagnosis not present

## 2014-12-18 DIAGNOSIS — O43123 Velamentous insertion of umbilical cord, third trimester: Secondary | ICD-10-CM | POA: Insufficient documentation

## 2014-12-21 ENCOUNTER — Ambulatory Visit (HOSPITAL_COMMUNITY)
Admission: RE | Admit: 2014-12-21 | Discharge: 2014-12-21 | Disposition: A | Discharge status: Home / Self Care | Payer: 59 | Source: Ambulatory Visit | Attending: Obstetrics | Admitting: Obstetrics

## 2014-12-21 ENCOUNTER — Encounter (HOSPITAL_COMMUNITY): Payer: Self-pay

## 2014-12-21 DIAGNOSIS — Z3A37 37 weeks gestation of pregnancy: Secondary | ICD-10-CM | POA: Diagnosis not present

## 2014-12-21 DIAGNOSIS — O09523 Supervision of elderly multigravida, third trimester: Secondary | ICD-10-CM | POA: Insufficient documentation

## 2014-12-21 DIAGNOSIS — Z3A38 38 weeks gestation of pregnancy: Secondary | ICD-10-CM | POA: Insufficient documentation

## 2014-12-21 DIAGNOSIS — O09529 Supervision of elderly multigravida, unspecified trimester: Secondary | ICD-10-CM | POA: Insufficient documentation

## 2014-12-22 ENCOUNTER — Ambulatory Visit (HOSPITAL_COMMUNITY): Payer: 59

## 2014-12-24 ENCOUNTER — Other Ambulatory Visit (HOSPITAL_COMMUNITY): Payer: Self-pay | Admitting: Maternal and Fetal Medicine

## 2014-12-24 ENCOUNTER — Encounter (HOSPITAL_COMMUNITY): Payer: Self-pay

## 2014-12-24 ENCOUNTER — Ambulatory Visit (HOSPITAL_COMMUNITY)
Admission: RE | Admit: 2014-12-24 | Discharge: 2014-12-24 | Disposition: A | Discharge status: Home / Self Care | Payer: 59 | Source: Ambulatory Visit | Attending: Maternal and Fetal Medicine | Admitting: Maternal and Fetal Medicine

## 2014-12-24 ENCOUNTER — Ambulatory Visit (HOSPITAL_COMMUNITY)
Admission: RE | Admit: 2014-12-24 | Discharge: 2014-12-24 | Disposition: A | Discharge status: Home / Self Care | Payer: 59 | Source: Ambulatory Visit | Attending: Obstetrics | Admitting: Obstetrics

## 2014-12-24 DIAGNOSIS — O09293 Supervision of pregnancy with other poor reproductive or obstetric history, third trimester: Secondary | ICD-10-CM

## 2014-12-24 DIAGNOSIS — O43123 Velamentous insertion of umbilical cord, third trimester: Secondary | ICD-10-CM | POA: Insufficient documentation

## 2014-12-24 DIAGNOSIS — O09523 Supervision of elderly multigravida, third trimester: Secondary | ICD-10-CM

## 2014-12-24 DIAGNOSIS — Z3A38 38 weeks gestation of pregnancy: Secondary | ICD-10-CM

## 2014-12-24 DIAGNOSIS — Z36 Encounter for antenatal screening of mother: Secondary | ICD-10-CM | POA: Insufficient documentation

## 2014-12-24 IMAGING — US US OB FOLLOW-UP
1 series · 12 of 28 positions shown · non-contrast
Comparison: none

[Series 1: us ob follow-up · 12 of 30 slices shown]
[im 2/30]
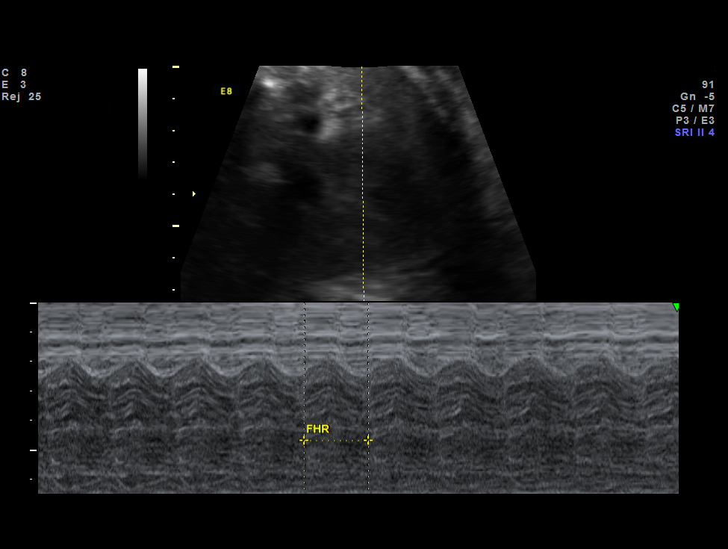
[im 4/30]
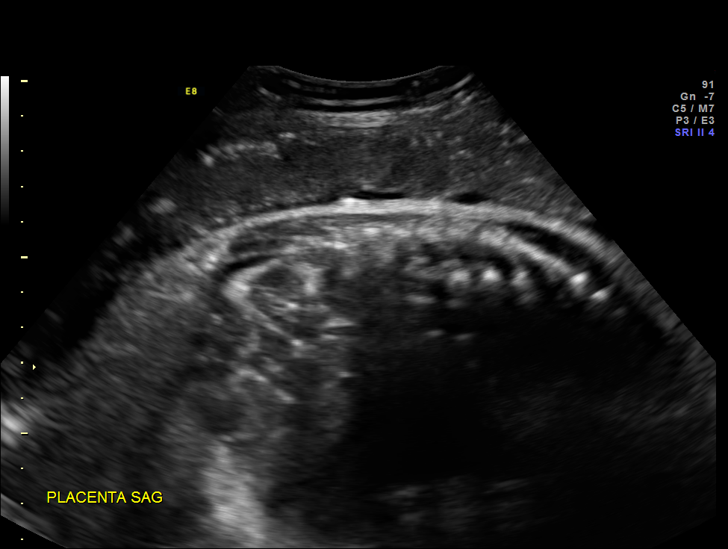
[im 6/30]
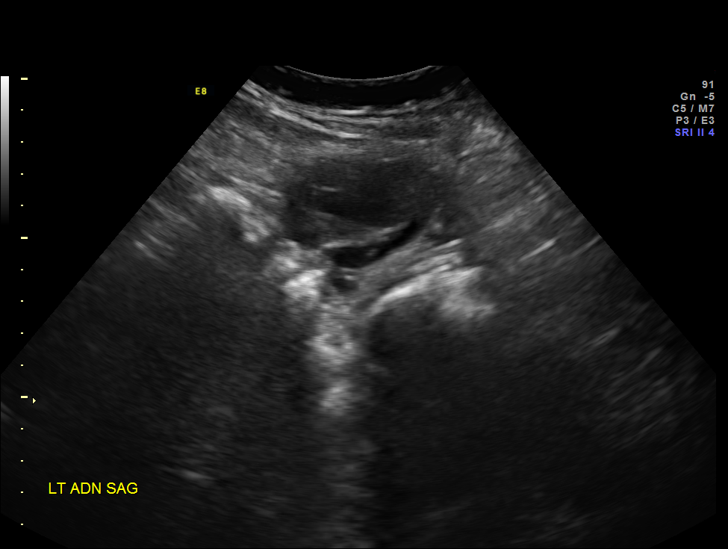
[im 9/30]
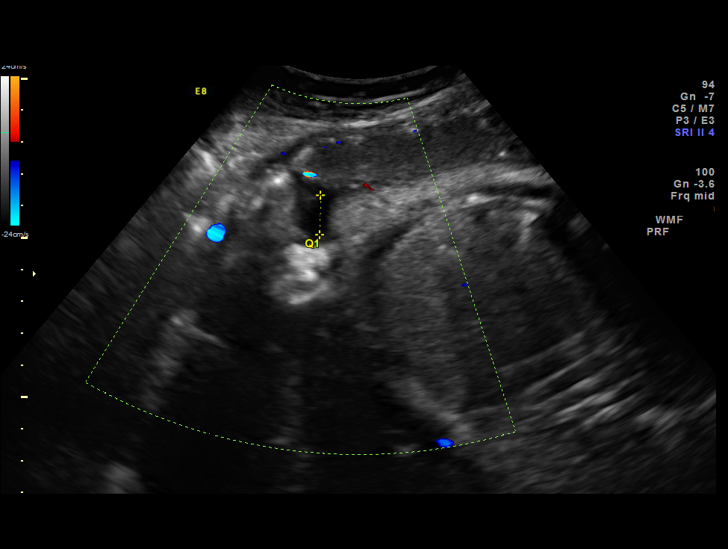
[im 11/30]
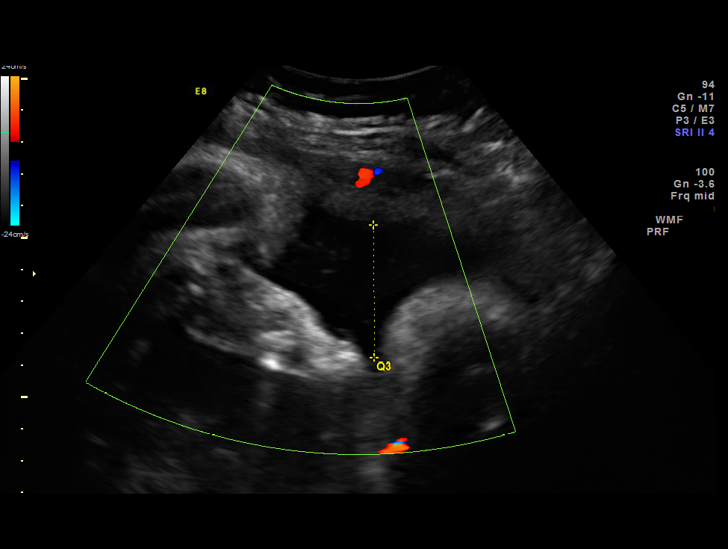
[im 13/30]
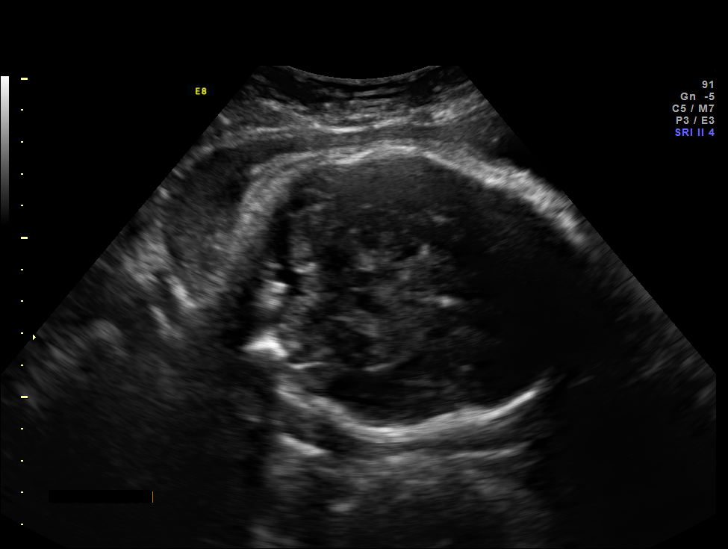
[im 17/30]
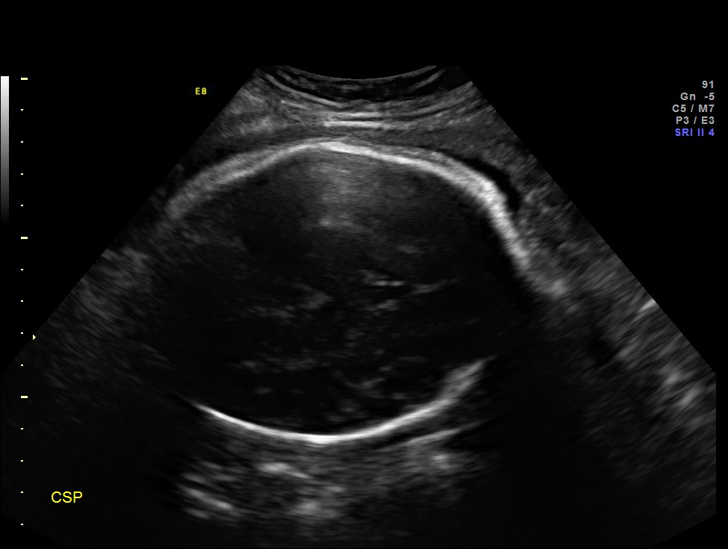
[im 19/30]
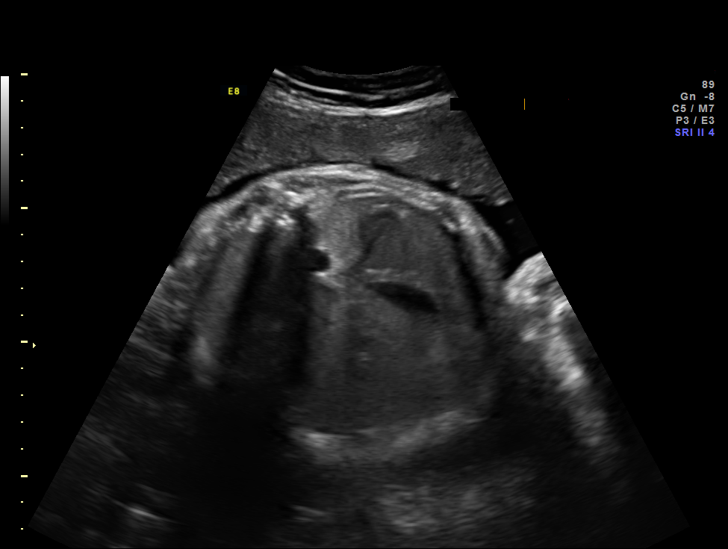
[im 21/30]
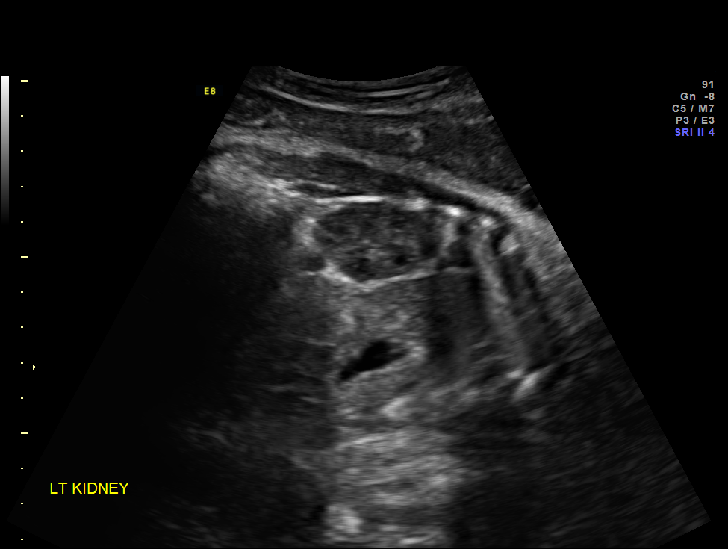
[im 24/30]
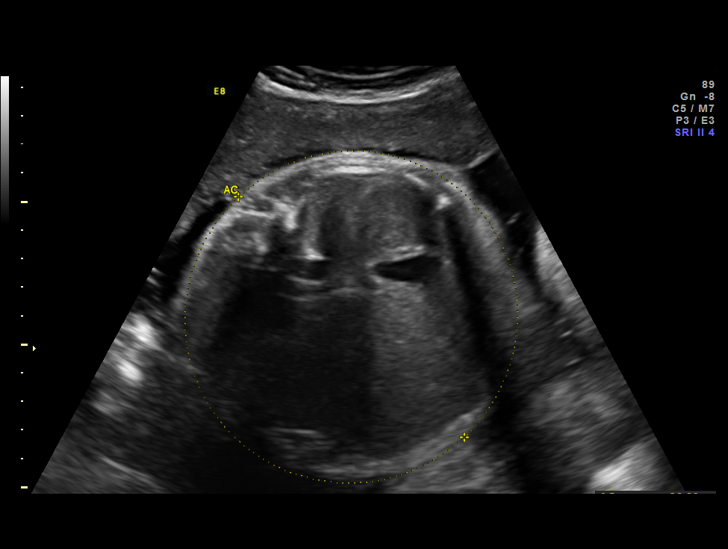
[im 26/30]
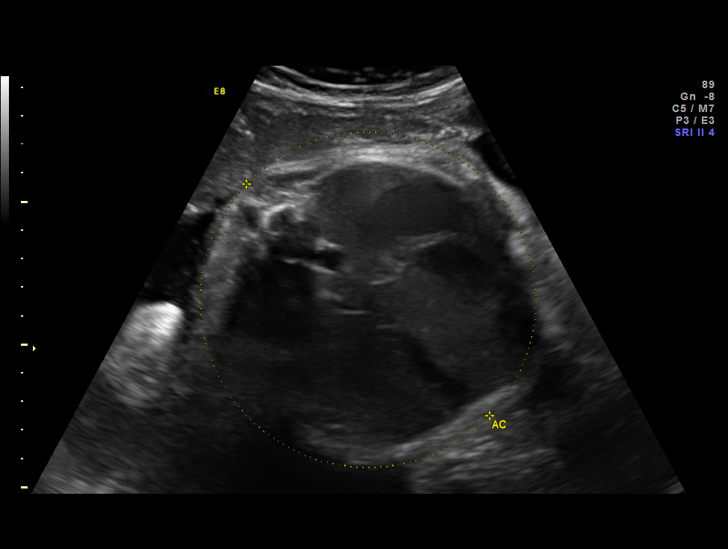
[im 28/30]
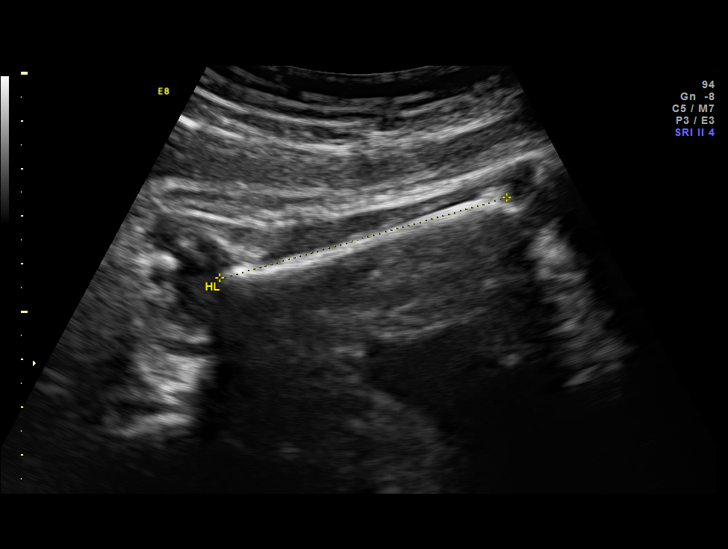

[12 of 28 positions shown; findings below may reference images not displayed]

OBSTETRICS REPORT
                      (Signed Final [DATE] [DATE])

Service(s) Provided

 US OB FOLLOW UP                                       76816.1
Indications

 38 weeks gestation of pregnancy
 Advanced maternal age multigravida (43), second       [O2]
 trimester; low risk NIPS
 Poor obstetric history: Previous gestational          [O2]
 diabetes
 Velamentous cord insertion
Fetal Evaluation

 Num Of Fetuses:    1
 Fetal Heart Rate:  157                          bpm
 Cardiac Activity:  Observed
 Presentation:      Cephalic
 Placenta:          Anterior, above cervical os
 P. Cord            Velamentous ins prev
 Insertion:
                    seen

 Amniotic Fluid
 AFI FV:      Subjectively within normal limits
 AFI Sum:     10.66   cm       32  %Tile     Larg Pckt:    4.16  cm
 RUQ:   1.24    cm   RLQ:    1.66   cm    LUQ:   3.6     cm   LLQ:    4.16   cm
Biometry

 BPD:     91.5  mm     G. Age:  37w 1d                CI:         77.0   70 - 86
 OFD:    118.8  mm                                    FL/HC:      22.0   20.6 -

 HC:     335.2  mm     G. Age:  38w 2d       28  %    HC/AC:      0.91   0.87 -

 AC:     367.5  mm     G. Age:  40w 5d     > 97  %    FL/BPD:     80.7   71 - 87
 FL:      73.8  mm     G. Age:  37w 6d       32  %    FL/AC:      20.1   20 - 24
 HUM:       63  mm     G. Age:  36w 4d       34  %
 CER:     48.4  mm     G. Age:  N/A          79  %

 Est. FW:    [O2]  gm      8 lb 4 oz   > 90  %
Gestational Age

 LMP:           40w 4d        Date:  [DATE]                 EDD:   [DATE]
 U/S Today:     38w 4d                                        EDD:   [DATE]
 Best:          38w 5d     Det. By:  U/S    ([DATE])        EDD:   [DATE]
Anatomy

 Cranium:          Appears normal         Aortic Arch:      Previously seen
 Fetal Cavum:      Appears normal         Ductal Arch:      Previously seen
 Ventricles:       Appears normal         Diaphragm:        Previously seen
 Choroid Plexus:   Previously seen        Stomach:          Appears normal, left
                                                            sided
 Cerebellum:       Appears normal         Abdomen:          Appears normal
 Posterior Fossa:  Appears normal         Abdominal Wall:   Previously seen
 Nuchal Fold:      Previously seen        Cord Vessels:     Previously seen
 Face:             Orbits and profile     Kidneys:          Appear normal
                   previously seen
 Lips:             Previously seen        Bladder:          Appears normal
 Heart:            Previously seen        Spine:            Previously seen
 RVOT:             Previously seen        Lower             Previously seen
                                          Extremities:
 LVOT:             Previously seen        Upper             Previously seen
                                          Extremities:

 Other:  Male gender. Heels and 5th digit previously seen. Technically difficult
         due to advanced GA and fetal position.
Targeted Anatomy

 Fetal Central Nervous System
 Cisterna Magna:
Cervix Uterus Adnexa

 Cervix:       Not visualized (advanced GA >[O2])

 Adnexa:     No abnormality visualized.
Impression

 SIUP at 38+5 weeks
 Cephalic presentation
 EFW is [O2] (>90th overall, AC>97th%'le)
 bilateral hydroceles
 normal amniotic fluid volume
 NST reactive
Recommendations

 1. given extreme AMA, recommend continued antenatal
 testing until delivery
 2. would recommend delivery 39-40 weeks given maternal
 age over 40 is associated with increased risk for IUFD after
 achieving term gestation.

 questions or concerns.

## 2014-12-25 ENCOUNTER — Inpatient Hospital Stay (HOSPITAL_COMMUNITY): Payer: 59 | Admitting: Anesthesiology

## 2014-12-25 ENCOUNTER — Encounter (HOSPITAL_COMMUNITY): Payer: Self-pay | Admitting: *Deleted

## 2014-12-25 ENCOUNTER — Inpatient Hospital Stay (HOSPITAL_COMMUNITY)
Admission: AD | Admit: 2014-12-25 | Discharge: 2014-12-27 | DRG: 775 | Disposition: A | Discharge status: Home / Self Care | Payer: 59 | Source: Ambulatory Visit | Attending: Obstetrics | Admitting: Obstetrics

## 2014-12-25 ENCOUNTER — Ambulatory Visit (HOSPITAL_COMMUNITY): Payer: 59

## 2014-12-25 DIAGNOSIS — IMO0001 Reserved for inherently not codable concepts without codable children: Secondary | ICD-10-CM

## 2014-12-25 LAB — CBC
HCT: 36.5 % (ref 36.0–46.0)
Hemoglobin: 11.4 g/dL — ABNORMAL LOW (ref 12.0–15.0)
MCH: 26.8 pg (ref 26.0–34.0)
MCHC: 31.2 g/dL (ref 30.0–36.0)
MCV: 85.9 fL (ref 78.0–100.0)
Platelets: 252 10*3/uL (ref 150–400)
RBC: 4.25 MIL/uL (ref 3.87–5.11)
RDW: 14.9 % (ref 11.5–15.5)
WBC: 11.4 10*3/uL — AB (ref 4.0–10.5)

## 2014-12-25 LAB — TYPE AND SCREEN
ABO/RH(D): A POS
ANTIBODY SCREEN: NEGATIVE

## 2014-12-25 LAB — ABO/RH: ABO/RH(D): A POS

## 2014-12-25 LAB — RPR: RPR Ser Ql: NONREACTIVE

## 2014-12-25 LAB — OB RESULTS CONSOLE GBS: GBS: NEGATIVE

## 2014-12-25 MED ORDER — OXYTOCIN BOLUS FROM INFUSION: 500.0000 mL | INTRAVENOUS | Status: DC

## 2014-12-25 MED ORDER — OXYTOCIN 40 UNITS IN LACTATED RINGERS INFUSION - SIMPLE MED
1.0000 m[IU]/min | INTRAVENOUS | Status: DC
  Administered 2014-12-25: 2 m[IU]/min via INTRAVENOUS

## 2014-12-25 MED ORDER — EPHEDRINE 5 MG/ML INJ: 10.0000 mg | INTRAVENOUS | Status: DC | PRN

## 2014-12-25 MED ORDER — OXYCODONE-ACETAMINOPHEN 5-325 MG PO TABS: 1.0000 | ORAL_TABLET | ORAL | Status: DC | PRN

## 2014-12-25 MED ORDER — OXYTOCIN 40 UNITS IN LACTATED RINGERS INFUSION - SIMPLE MED
62.5000 mL/h | INTRAVENOUS | Status: DC
  Filled 2014-12-25: qty 1000

## 2014-12-25 MED ORDER — TERBUTALINE SULFATE 1 MG/ML IJ SOLN: 0.2500 mg | Freq: Once | INTRAMUSCULAR | Status: AC | PRN

## 2014-12-25 MED ORDER — ONDANSETRON HCL 4 MG/2ML IJ SOLN: 4.0000 mg | Freq: Four times a day (QID) | INTRAMUSCULAR | Status: DC | PRN

## 2014-12-25 MED ORDER — FENTANYL 2.5 MCG/ML BUPIVACAINE 1/10 % EPIDURAL INFUSION (WH - ANES)
14.0000 mL/h | INTRAMUSCULAR | Status: DC | PRN
  Administered 2014-12-25: 14 mL/h via EPIDURAL
  Administered 2014-12-25: 13 mL/h via EPIDURAL
  Administered 2014-12-26: 14 mL/h via EPIDURAL
  Filled 2014-12-25 (×3): qty 125

## 2014-12-25 MED ORDER — FLEET ENEMA 7-19 GM/118ML RE ENEM: 1.0000 | ENEMA | RECTAL | Status: DC | PRN

## 2014-12-25 MED ORDER — PHENYLEPHRINE 40 MCG/ML (10ML) SYRINGE FOR IV PUSH (FOR BLOOD PRESSURE SUPPORT): 80.0000 ug | PREFILLED_SYRINGE | INTRAVENOUS | Status: DC | PRN

## 2014-12-25 MED ORDER — CITRIC ACID-SODIUM CITRATE 334-500 MG/5ML PO SOLN: 30.0000 mL | ORAL | Status: DC | PRN

## 2014-12-25 MED ORDER — BUTORPHANOL TARTRATE 1 MG/ML IJ SOLN: 1.0000 mg | INTRAMUSCULAR | Status: DC | PRN

## 2014-12-25 MED ORDER — LACTATED RINGERS IV SOLN: 500.0000 mL | INTRAVENOUS | Status: DC | PRN

## 2014-12-25 MED ORDER — LIDOCAINE HCL (PF) 1 % IJ SOLN
30.0000 mL | INTRAMUSCULAR | Status: DC | PRN
  Filled 2014-12-25: qty 30

## 2014-12-25 MED ORDER — LACTATED RINGERS IV SOLN
INTRAVENOUS | Status: DC
  Administered 2014-12-25 (×2): via INTRAVENOUS

## 2014-12-25 MED ORDER — LIDOCAINE HCL (PF) 1 % IJ SOLN
INTRAMUSCULAR | Status: DC | PRN
  Administered 2014-12-25 (×2): 4 mL

## 2014-12-25 MED ORDER — PHENYLEPHRINE 40 MCG/ML (10ML) SYRINGE FOR IV PUSH (FOR BLOOD PRESSURE SUPPORT)
80.0000 ug | PREFILLED_SYRINGE | INTRAVENOUS | Status: DC | PRN
  Filled 2014-12-25: qty 20

## 2014-12-25 MED ORDER — FENTANYL 2.5 MCG/ML BUPIVACAINE 1/10 % EPIDURAL INFUSION (WH - ANES)
INTRAMUSCULAR | Status: DC | PRN
  Administered 2014-12-25: 13 mL/h via EPIDURAL

## 2014-12-25 MED ORDER — ACETAMINOPHEN 325 MG PO TABS: 650.0000 mg | ORAL_TABLET | ORAL | Status: DC | PRN

## 2014-12-25 MED ORDER — OXYCODONE-ACETAMINOPHEN 5-325 MG PO TABS: 2.0000 | ORAL_TABLET | ORAL | Status: DC | PRN

## 2014-12-25 MED ORDER — LACTATED RINGERS IV SOLN
500.0000 mL | Freq: Once | INTRAVENOUS | Status: AC
  Administered 2014-12-25: 500 mL via INTRAVENOUS

## 2014-12-25 MED ORDER — DIPHENHYDRAMINE HCL 50 MG/ML IJ SOLN: 12.5000 mg | INTRAMUSCULAR | Status: DC | PRN

## 2014-12-25 NOTE — Anesthesia Procedure Notes (Addendum)
Epidural Patient location during procedure: OB Start time: 12/25/2014 10:23 AM  Staffing Anesthesiologist: Shenna Brissette A. Performed by: anesthesiologist   Preanesthetic Checklist Completed: patient identified, site marked, surgical consent, pre-op evaluation, timeout performed, IV checked, risks and benefits discussed and monitors and equipment checked  Epidural Patient position: sitting Prep: site prepped and draped and DuraPrep Patient monitoring: continuous pulse ox and blood pressure Approach: midline Location: L3-L4 Injection technique: LOR air  Needle:  Needle type: Tuohy  Needle gauge: 17 G Needle length: 9 cm and 9 Needle insertion depth: 5 cm cm Catheter type: closed end flexible Catheter size: 19 Gauge Catheter at skin depth: 10 cm Test dose: negative and Other  Assessment Events: blood not aspirated, injection not painful, no injection resistance, negative IV test and no paresthesia  Additional Notes Patient identified. Risks and benefits discussed including failed block, incomplete  Pain control, post dural puncture headache, nerve damage, paralysis, blood pressure Changes, nausea, vomiting, reactions to medications-both toxic and allergic and post Partum back pain. All questions were answered. Patient expressed understanding and wished to proceed. Sterile technique was used throughout procedure. Epidural site was Dressed with sterile barrier dressing. No paresthesias, signs of intravascular injection Or signs of intrathecal spread were encountered.  Patient was more comfortable after the epidural was dosed. Please see RN's note for documentation of vital signs and FHR which are stable.

## 2014-12-25 NOTE — Anesthesia Preprocedure Evaluation (Signed)
Anesthesia Evaluation  Patient identified by MRN, date of birth, ID band Patient awake    Reviewed: Allergy & Precautions, NPO status , Patient's Chart, lab work & pertinent test results  Airway Mallampati: II  TM Distance: >3 FB Neck ROM: Full    Dental no notable dental hx. (+) Teeth Intact   Pulmonary neg pulmonary ROS,  breath sounds clear to auscultation  Pulmonary exam normal       Cardiovascular negative cardio ROS  Rhythm:Regular Rate:Normal     Neuro/Psych negative neurological ROS  negative psych ROS   GI/Hepatic Neg liver ROS, GERD-  ,  Endo/Other  negative endocrine ROS  Renal/GU negative Renal ROS  negative genitourinary   Musculoskeletal negative musculoskeletal ROS (+)   Abdominal (+) + obese,   Peds  Hematology  (+) anemia ,   Anesthesia Other Findings   Reproductive/Obstetrics (+) Pregnancy AMA                             Anesthesia Physical Anesthesia Plan  ASA: II  Anesthesia Plan: Epidural   Post-op Pain Management:    Induction:   Airway Management Planned: Natural Airway  Additional Equipment:   Intra-op Plan:   Post-operative Plan:   Informed Consent: I have reviewed the patients History and Physical, chart, labs and discussed the procedure including the risks, benefits and alternatives for the proposed anesthesia with the patient or authorized representative who has indicated his/her understanding and acceptance.   Dental advisory given  Plan Discussed with: Anesthesiologist  Anesthesia Plan Comments:         Anesthesia Quick Evaluation

## 2014-12-25 NOTE — MAU Note (Signed)
PT  SAYS  [facility]-  DENIES HSV AND MRSA.  GBS- NEG.   VE - 1 CM IN OFFICE.

## 2014-12-25 NOTE — MAU Note (Signed)
EFM off for patient to ambulate x2 hours as ordered by Dr. Ruthann Cancer; followed by SVE and callback.

## 2014-12-25 NOTE — MAU Note (Signed)
Patient returned from ambulating; efm reapplied and assessing; patient states pain has increased.

## 2014-12-26 ENCOUNTER — Encounter (HOSPITAL_COMMUNITY): Payer: Self-pay | Admitting: *Deleted

## 2014-12-26 LAB — CBC
HEMATOCRIT: 31.6 % — AB (ref 36.0–46.0)
HEMOGLOBIN: 10.3 g/dL — AB (ref 12.0–15.0)
MCH: 27.8 pg (ref 26.0–34.0)
MCHC: 32.6 g/dL (ref 30.0–36.0)
MCV: 85.2 fL (ref 78.0–100.0)
PLATELETS: 185 10*3/uL (ref 150–400)
RBC: 3.71 MIL/uL — ABNORMAL LOW (ref 3.87–5.11)
RDW: 14.9 % (ref 11.5–15.5)
WBC: 21.3 10*3/uL — AB (ref 4.0–10.5)

## 2014-12-26 MED ORDER — SIMETHICONE 80 MG PO CHEW: 80.0000 mg | CHEWABLE_TABLET | ORAL | Status: DC | PRN

## 2014-12-26 MED ORDER — DIPHENHYDRAMINE HCL 25 MG PO CAPS: 25.0000 mg | ORAL_CAPSULE | Freq: Four times a day (QID) | ORAL | Status: DC | PRN

## 2014-12-26 MED ORDER — OXYCODONE-ACETAMINOPHEN 5-325 MG PO TABS: 2.0000 | ORAL_TABLET | ORAL | Status: DC | PRN

## 2014-12-26 MED ORDER — TETANUS-DIPHTH-ACELL PERTUSSIS 5-2.5-18.5 LF-MCG/0.5 IM SUSP: 0.5000 mL | Freq: Once | INTRAMUSCULAR | Status: DC

## 2014-12-26 MED ORDER — FERROUS SULFATE 325 (65 FE) MG PO TABS
325.0000 mg | ORAL_TABLET | Freq: Two times a day (BID) | ORAL | Status: DC
  Administered 2014-12-26 – 2014-12-27 (×3): 325 mg via ORAL
  Filled 2014-12-26 (×3): qty 1

## 2014-12-26 MED ORDER — WITCH HAZEL-GLYCERIN EX PADS: 1.0000 "application " | MEDICATED_PAD | CUTANEOUS | Status: DC | PRN

## 2014-12-26 MED ORDER — BENZOCAINE-MENTHOL 20-0.5 % EX AERO: 1.0000 "application " | INHALATION_SPRAY | CUTANEOUS | Status: DC | PRN

## 2014-12-26 MED ORDER — ONDANSETRON HCL 4 MG PO TABS: 4.0000 mg | ORAL_TABLET | ORAL | Status: DC | PRN

## 2014-12-26 MED ORDER — ZOLPIDEM TARTRATE 5 MG PO TABS: 5.0000 mg | ORAL_TABLET | Freq: Every evening | ORAL | Status: DC | PRN

## 2014-12-26 MED ORDER — ONDANSETRON HCL 4 MG/2ML IJ SOLN: 4.0000 mg | INTRAMUSCULAR | Status: DC | PRN

## 2014-12-26 MED ORDER — SENNOSIDES-DOCUSATE SODIUM 8.6-50 MG PO TABS
2.0000 | ORAL_TABLET | ORAL | Status: DC
  Administered 2014-12-26: 2 via ORAL
  Filled 2014-12-26: qty 2

## 2014-12-26 MED ORDER — IBUPROFEN 600 MG PO TABS
600.0000 mg | ORAL_TABLET | Freq: Four times a day (QID) | ORAL | Status: DC
  Administered 2014-12-26 – 2014-12-27 (×6): 600 mg via ORAL
  Filled 2014-12-26 (×6): qty 1

## 2014-12-26 MED ORDER — LANOLIN HYDROUS EX OINT
TOPICAL_OINTMENT | CUTANEOUS | Status: DC | PRN
Start: 2014-12-26 — End: 2014-12-27

## 2014-12-26 MED ORDER — OXYCODONE-ACETAMINOPHEN 5-325 MG PO TABS: 1.0000 | ORAL_TABLET | ORAL | Status: DC | PRN

## 2014-12-26 MED ORDER — PRENATAL MULTIVITAMIN CH
1.0000 | ORAL_TABLET | Freq: Every day | ORAL | Status: DC
  Administered 2014-12-26 – 2014-12-27 (×2): 1 via ORAL
  Filled 2014-12-26 (×2): qty 1

## 2014-12-26 MED ORDER — DIBUCAINE 1 % RE OINT: 1.0000 "application " | TOPICAL_OINTMENT | RECTAL | Status: DC | PRN

## 2014-12-26 NOTE — Lactation Note (Signed)
This note was copied from the chart of [facility]. Lactation Consultation Note  Patient Name: Elizabeth Hamilton [id number] Date: 12/26/2014 Reason for consult: Initial assessment Mom reports baby is nursing well, she is experienced BF. Mom denies questions/concerns. Encouraged to BF with feeding ques. Lactation brochure left for review, advised of OP services and support group. Encouraged to call for questions/concerns or for assist if needed.   Maternal Data Has patient been taught Hand Expression?: No (Mom reports she knows how to hand express) Does the patient have breastfeeding experience prior to this delivery?: Yes  Feeding Feeding Type: Breast Fed Length of feed: 15 min  LATCH Score/Interventions Latch: Grasps breast easily, tongue down, lips flanged, rhythmical sucking.  Audible Swallowing: A few with stimulation Intervention(s): Skin to skin;Hand expression  Type of Nipple: Everted at rest and after stimulation  Comfort (Breast/Nipple): Soft / non-tender     Hold (Positioning): No assistance needed to correctly position infant at breast.  LATCH Score: 9  Lactation Tools Discussed/Used     Consult Status Consult Status: PRN    Katrine Coho 12/26/2014, 6:17 PM

## 2014-12-26 NOTE — Anesthesia Postprocedure Evaluation (Signed)
Anesthesia Post Note  Patient: Elizabeth Hamilton  Procedure(s) Performed: * No procedures listed *  Anesthesia type: Epidural  Patient location: Mother/Baby  Post pain: Pain level controlled  Post assessment: Post-op Vital signs reviewed  Last Vitals:  Filed Vitals:   12/26/14 0550  BP: 92/54  Pulse: 105  Temp: 37.1 C  Resp: 18    Post vital signs: Reviewed  Level of consciousness: awake  Complications: No apparent anesthesia complications

## 2014-12-26 NOTE — Progress Notes (Signed)
Patient ID: Elizabeth Hamilton, female   DOB: 29-Aug-1971, 44 y.o.   MRN: [id number] Postpartum day 0 Vital signs normal Fundus firm Lochia moderate Doing well

## 2014-12-27 NOTE — Lactation Note (Signed)
This note was copied from the chart of [place]. Lactation Consultation Note  P3, Ex BF.  Denies problems or questions. Mother states baby is eating often and so her nipples are sore. Provided her comfort gels. Reviewed engorgement care and monitor voids/stools. Mom encouraged to feed baby 8-12 times/24 hours and with feeding cues.     Patient Name: Boy Nancee Brownrigg [id number] Date: 12/27/2014 Reason for consult: Follow-up assessment   Maternal Data    Feeding    LATCH Score/Interventions                      Lactation Tools Discussed/Used     Consult Status Consult Status: Complete    Carlye Grippe 12/27/2014, 10:07 AM

## 2014-12-27 NOTE — Discharge Instructions (Signed)
Discharge instructions   You can wash your hair  Shower  Eat what you want  Drink what you want  See me in 6 weeks  Your ankles are going to swell more in the next 2 weeks than when pregnant  No sex for 6 weeks   Jakyah Bradby A, MD 12/27/2014

## 2014-12-27 NOTE — Discharge Summary (Signed)
Obstetric Discharge Summary Reason for Admission: onset of labor Prenatal Procedures: none Intrapartum Procedures: spontaneous vaginal delivery Postpartum Procedures: none Complications-Operative and Postpartum: none HEMOGLOBIN  Date Value Ref Range Status  12/26/2014 10.3* 12.0 - 15.0 g/dL Final   HCT  Date Value Ref Range Status  12/26/2014 31.6* 36.0 - 46.0 % Final    Physical Exam:  General: alert Lochia: appropriate Uterine Fundus: firm Incision: healing well DVT Evaluation: No evidence of DVT seen on physical exam.  Discharge Diagnoses: Term Pregnancy-delivered  Discharge Information: Date: 12/27/2014 Activity: pelvic rest Diet: routine Medications: Percocet Condition: stable Instructions: refer to practice specific booklet Discharge to: home Follow-up Information    Follow up with Melanie Openshaw A, MD In 6 weeks.   Specialty:  Obstetrics and Gynecology   Contact information:   [address] [place] [address] [phone]       Newborn Data: Live born female  Birth Weight: 8 lb (3629 g) APGAR: 8, 9  Home with mother.  Elizabeth Hamilton A 12/27/2014, 6:20 AM

## 2014-12-27 NOTE — Progress Notes (Signed)
Patient ID: Elizabeth Hamilton, female   DOB: 1971-05-16, 44 y.o.   MRN: [id number] Postpartum day one Vital signs normal Fundus firm Lochia moderate wants early discharge

## 2014-12-28 ENCOUNTER — Ambulatory Visit (HOSPITAL_COMMUNITY): Payer: 59

## 2014-12-31 ENCOUNTER — Other Ambulatory Visit (HOSPITAL_COMMUNITY): Payer: 59

## 2015-01-04 NOTE — H&P (Signed)
This is Dr. Gracy Racer dictating the history and physical on  Elizabeth Hamilton  she's a 44 year old gravida 5 para 3 at term was admitted with ruptured membranes fluids clear she was followed by MFM with frequent ultrasounds and and before meals because of maternal age and she is now in labor Past medical history negative Past surgical history negative Social history negative System review negative Physical exam well-developed female in early labor HEENT negative Lungs clear to P&A Heart regular rhythm no murmurs or gallops Breasts negative Abdomen term Cervix 2 cm membranes ruptured fluid clear and the vertex -2 Extremities negative

## 2015-05-13 ENCOUNTER — Other Ambulatory Visit: Payer: Self-pay

## 2015-05-13 DIAGNOSIS — Z1231 Encounter for screening mammogram for malignant neoplasm of breast: Secondary | ICD-10-CM

## 2015-06-21 ENCOUNTER — Ambulatory Visit
Admission: RE | Admit: 2015-06-21 | Discharge: 2015-06-21 | Disposition: A | Discharge status: Home / Self Care | Payer: 59 | Source: Ambulatory Visit

## 2015-06-21 DIAGNOSIS — Z1231 Encounter for screening mammogram for malignant neoplasm of breast: Secondary | ICD-10-CM

## 2015-10-24 NOTE — L&D Delivery Note (Signed)
Delivery Note At 3:44 AM a viable female was delivered via Vaginal, Spontaneous Delivery (Presentation: ;  ).  APGAR: 9, 10; weight 6 lb 11.6 oz (3050 g).   Placenta status: intact.  Cord:  with the following complications: .    Anesthesia:  none Episiotomy:  none Lacerations:  none Est. Blood Loss (mL):  400cc  Mom to postpartum.  Baby to Couplet care / Skin to Skin.  HARRAWAY-SMITH, Cala Kruckenberg 06/04/2016, 4:09 AM

## 2015-11-11 DIAGNOSIS — Z3480 Encounter for supervision of other normal pregnancy, unspecified trimester: Secondary | ICD-10-CM | POA: Diagnosis not present

## 2015-11-11 DIAGNOSIS — Z1159 Encounter for screening for other viral diseases: Secondary | ICD-10-CM | POA: Diagnosis not present

## 2015-11-11 DIAGNOSIS — Z113 Encounter for screening for infections with a predominantly sexual mode of transmission: Secondary | ICD-10-CM | POA: Diagnosis not present

## 2015-11-11 DIAGNOSIS — Z3201 Encounter for pregnancy test, result positive: Secondary | ICD-10-CM | POA: Diagnosis not present

## 2015-11-11 DIAGNOSIS — Z3491 Encounter for supervision of normal pregnancy, unspecified, first trimester: Secondary | ICD-10-CM | POA: Diagnosis not present

## 2015-11-11 LAB — OB RESULTS CONSOLE ANTIBODY SCREEN: Antibody Screen: NEGATIVE

## 2015-11-11 LAB — OB RESULTS CONSOLE RPR: RPR: NONREACTIVE

## 2015-11-11 LAB — OB RESULTS CONSOLE GC/CHLAMYDIA
CHLAMYDIA, DNA PROBE: NEGATIVE
GC PROBE AMP, GENITAL: NEGATIVE

## 2015-11-11 LAB — OB RESULTS CONSOLE HGB/HCT, BLOOD
HCT: 41 %
Hemoglobin: 13.4 g/dL

## 2015-11-11 LAB — OB RESULTS CONSOLE ABO/RH: RH Type: POSITIVE

## 2015-11-11 LAB — OB RESULTS CONSOLE RUBELLA ANTIBODY, IGM: RUBELLA: IMMUNE

## 2015-11-11 LAB — OB RESULTS CONSOLE HIV ANTIBODY (ROUTINE TESTING): HIV: NONREACTIVE

## 2015-11-11 LAB — OB RESULTS CONSOLE PLATELET COUNT: PLATELETS: 336 10*3/uL

## 2015-11-11 LAB — OB RESULTS CONSOLE HEPATITIS B SURFACE ANTIGEN: HEP B S AG: NEGATIVE

## 2015-11-26 DIAGNOSIS — Z36 Encounter for antenatal screening of mother: Secondary | ICD-10-CM | POA: Diagnosis not present

## 2015-12-29 DIAGNOSIS — Z3492 Encounter for supervision of normal pregnancy, unspecified, second trimester: Secondary | ICD-10-CM | POA: Diagnosis not present

## 2015-12-29 DIAGNOSIS — Z3480 Encounter for supervision of other normal pregnancy, unspecified trimester: Secondary | ICD-10-CM | POA: Diagnosis not present

## 2016-01-06 ENCOUNTER — Encounter (HOSPITAL_COMMUNITY): Payer: Self-pay | Admitting: Obstetrics

## 2016-01-19 ENCOUNTER — Other Ambulatory Visit (HOSPITAL_COMMUNITY): Payer: Self-pay | Admitting: Obstetrics

## 2016-01-19 DIAGNOSIS — O09522 Supervision of elderly multigravida, second trimester: Secondary | ICD-10-CM

## 2016-01-19 DIAGNOSIS — Z3689 Encounter for other specified antenatal screening: Secondary | ICD-10-CM

## 2016-01-21 ENCOUNTER — Encounter (HOSPITAL_COMMUNITY): Payer: Self-pay

## 2016-01-21 ENCOUNTER — Ambulatory Visit (HOSPITAL_COMMUNITY)
Admission: RE | Admit: 2016-01-21 | Discharge: 2016-01-21 | Disposition: A | Discharge status: Home / Self Care | Payer: 59 | Source: Ambulatory Visit | Attending: Obstetrics | Admitting: Obstetrics

## 2016-01-21 DIAGNOSIS — Z36 Encounter for antenatal screening of mother: Secondary | ICD-10-CM | POA: Diagnosis not present

## 2016-01-21 DIAGNOSIS — Z3A20 20 weeks gestation of pregnancy: Secondary | ICD-10-CM | POA: Diagnosis not present

## 2016-01-21 DIAGNOSIS — O09529 Supervision of elderly multigravida, unspecified trimester: Secondary | ICD-10-CM

## 2016-01-21 DIAGNOSIS — O283 Abnormal ultrasonic finding on antenatal screening of mother: Secondary | ICD-10-CM | POA: Insufficient documentation

## 2016-01-21 DIAGNOSIS — O289 Unspecified abnormal findings on antenatal screening of mother: Secondary | ICD-10-CM | POA: Diagnosis not present

## 2016-01-21 DIAGNOSIS — O09522 Supervision of elderly multigravida, second trimester: Secondary | ICD-10-CM | POA: Diagnosis not present

## 2016-01-21 DIAGNOSIS — O281 Abnormal biochemical finding on antenatal screening of mother: Secondary | ICD-10-CM | POA: Diagnosis not present

## 2016-01-21 DIAGNOSIS — Z3A19 19 weeks gestation of pregnancy: Secondary | ICD-10-CM | POA: Diagnosis not present

## 2016-01-21 DIAGNOSIS — Z3689 Encounter for other specified antenatal screening: Secondary | ICD-10-CM

## 2016-01-21 DIAGNOSIS — O280 Abnormal hematological finding on antenatal screening of mother: Secondary | ICD-10-CM | POA: Insufficient documentation

## 2016-01-21 DIAGNOSIS — Z315 Encounter for genetic counseling: Secondary | ICD-10-CM | POA: Diagnosis not present

## 2016-01-21 NOTE — Progress Notes (Signed)
Genetic Counseling  High-Risk Gestation Note  Appointment Date:  01/21/2016 Referred By: Frederico Hamman, MD Date of Birth:  [birth year] Partner:  Bing Quarry   Pregnancy [id number] Estimated Date of Delivery: 06/07/16 Estimated Gestational Age: 109w2d Attending: Renella Cunas, MD  Elizabeth Hamilton and her husband, Mr. Mama Podolak, were seen for genetic counseling because of a maternal age of 45 y.o..   In Summary:   Discussed maternal age related risk for fetal aneuploidy in second trimester  Quad screen performed through [facility] laboratory screen positive for Down syndrome with 1 in 71 risk (age alone risk is 1 in 27)  Detailed ultrasound performed today; Visualized fetal anatomy within normal limits  Echogenic intracardiac focus visualized  EIF is typically benign variant  Adjusted risk for fetal Down syndrome approximately 1 in 24 (4%)  Patient elected to pursue NIPS (Panorama) today; declined amniocentesis     They were counseled regarding maternal age and the association with risk for chromosome conditions due to nondisjunction with aging of the ova.   We reviewed chromosomes, nondisjunction, and the associated 1 in 34 risk for fetal aneuploidy related to a maternal age of 45 y.o. at [redacted]w[redacted]d gestation.  They were counseled that the risk for aneuploidy decreases as gestational age increases, accounting for those pregnancies which spontaneously abort.  We specifically discussed Down syndrome (trisomy 53), trisomies 43 and 40, and sex chromosome aneuploidies (47,XXX and 47,XXY) including the common features and prognoses of each.   We reviewed results of Quad screen through [facility] laboratory, which were screen positive for Down syndrome. We discussed that 1 in 48 screen risk, which was adjusted from her a priori age related risk of 1 in 3 for fetal Down syndrome. They were counseled that screening tests are used to modify a patient's a priori risk for  conditions, typically based on age. This estimated provides a pregnancy specific risk assessment but is not diagnostic for these conditions. Additionally, we discussed the Quad screen reduced risks for fetal Trisomy 18 and open neural tube defects.   Detailed ultrasound was performed today. Visualized fetal anatomy was within normal limits. Echogenic intracardiac focus (EIF) was visualized today. We reviewed the benefits and limitations of ultrasound as a screening tool for fetal aneuploidy. They understand that ultrasound cannot diagnose or rule out chromosome conditions and cannot diagnose or rule out all birth defects or genetic conditions. An isolated echogenic focus is generally believed to be a normal variation without any concerns for the pregnancy.  Isolated echogenic cardiac foci are not associated with congenital heart defects in the baby or compromised cardiac function after birth.  However, an echogenic cardiac focus is associated with a slightly increased chance for Down syndrome in the pregnancy. Thus, the presence of an EIF would increase the risk for Down syndrome above the patient's Quad screen result of 1 in 48 to 1 in  24 (4%).   We reviewed the additional available screening option of noninvasive prenatal screening (NIPS)/cell free DNA (cfDNA) testing.  Specifically, we discussed the conditions for which the test screens, the detection rates, and false positive rates of each. They were also counseled regarding diagnostic testing via amniocentesis. We reviewed the approximate 1 in [id number] risk for complications for amniocentesis, including spontaneous pregnancy loss. After consideration of all the options, she elected to proceed with NIPS (Panorama through [facility] laboratory). They were counseled regarding the expense of NIPS, and that approximately $3000 is billed to the insurance provider.  The amount she will  be responsible for, out of pocket, depends upon the  specific plan and is subject  to co-pay, co-insurance and/or deductible.  Those results will be available in 8-10 days.  They understand that screening tests cannot rule out all birth defects or genetic syndromes. The patient was advised of this limitation and states she still does not want additional testing at this time.   Both family histories were reviewed and updated from their previous genetic counseling visit in 2015. The family histories were found to be noncontributory for birth defects, intellectual disability, and known genetic conditions. Without further information regarding the provided family history, an accurate genetic risk cannot be calculated. Further genetic counseling is warranted if more information is obtained.  Mrs. NYRA DUSSEAULT denied exposure to environmental toxins or chemical agents. She denied the use of alcohol, tobacco or street drugs. She denied significant viral illnesses during the course of her pregnancy. Her medical and surgical histories were noncontributory.   I counseled this couple regarding the above risks and available options.  The approximate face-to-face time with the genetic counselor was 30 minutes.  Chipper Oman, MS,  Certified Genetic Counselor 01/21/2016

## 2016-01-26 ENCOUNTER — Encounter (HOSPITAL_COMMUNITY): Payer: Self-pay

## 2016-01-26 ENCOUNTER — Other Ambulatory Visit (HOSPITAL_COMMUNITY): Payer: Self-pay

## 2016-01-27 ENCOUNTER — Telehealth (HOSPITAL_COMMUNITY): Payer: Self-pay | Admitting: MS"

## 2016-01-27 NOTE — Telephone Encounter (Signed)
Called Elizabeth Hamilton to discuss her prenatal cell free DNA test results.  Mrs. Elizabeth Hamilton had Panorama testing through [place] laboratories.  Testing was offered because of maternal age.   The patient was identified by name and DOB.  We reviewed that these are within normal limits, showing a less than 1 in 10,000 risk for trisomies 21, 18 and 13, and monosomy X (Turner syndrome).  In addition, the risk for triploidy/vanishing twin and sex chromosome trisomies (47,XXX and 47,XXY) was also low risk.  We reviewed that this testing identifies > 99% of pregnancies with trisomy 58, trisomy 13, sex chromosome trisomies (47,XXX and 47,XXY), and triploidy. The detection rate for trisomy 18 is 96%.  The detection rate for monosomy X is ~92%.  The false positive rate is <0.1% for all conditions. Testing was also consistent with female fetal sex.  The patient did not wish to know fetal sex.  She understands that this testing does not identify all genetic conditions.  All questions were answered to her satisfaction, she was encouraged to call with additional questions or concerns.  Chipper Oman, MS Certified Genetic Counselor 01/27/2016 4:42 PM

## 2016-01-28 ENCOUNTER — Other Ambulatory Visit (HOSPITAL_COMMUNITY): Payer: Self-pay

## 2016-03-09 ENCOUNTER — Other Ambulatory Visit (HOSPITAL_COMMUNITY): Payer: Self-pay | Admitting: Obstetrics

## 2016-03-09 DIAGNOSIS — O09523 Supervision of elderly multigravida, third trimester: Secondary | ICD-10-CM

## 2016-03-22 DIAGNOSIS — Z3492 Encounter for supervision of normal pregnancy, unspecified, second trimester: Secondary | ICD-10-CM | POA: Diagnosis not present

## 2016-03-22 DIAGNOSIS — Z113 Encounter for screening for infections with a predominantly sexual mode of transmission: Secondary | ICD-10-CM | POA: Diagnosis not present

## 2016-03-22 DIAGNOSIS — Z3480 Encounter for supervision of other normal pregnancy, unspecified trimester: Secondary | ICD-10-CM | POA: Diagnosis not present

## 2016-03-29 ENCOUNTER — Ambulatory Visit (HOSPITAL_COMMUNITY)
Admission: RE | Admit: 2016-03-29 | Discharge: 2016-03-29 | Disposition: A | Discharge status: Home / Self Care | Payer: 59 | Source: Ambulatory Visit | Attending: Obstetrics | Admitting: Obstetrics

## 2016-03-29 ENCOUNTER — Encounter (HOSPITAL_COMMUNITY): Payer: Self-pay

## 2016-03-29 ENCOUNTER — Ambulatory Visit (HOSPITAL_COMMUNITY): Admission: RE | Admit: 2016-03-29 | Payer: 59 | Source: Ambulatory Visit

## 2016-03-29 DIAGNOSIS — Z3A29 29 weeks gestation of pregnancy: Secondary | ICD-10-CM | POA: Insufficient documentation

## 2016-03-29 DIAGNOSIS — O283 Abnormal ultrasonic finding on antenatal screening of mother: Secondary | ICD-10-CM | POA: Insufficient documentation

## 2016-03-29 DIAGNOSIS — Z3A30 30 weeks gestation of pregnancy: Secondary | ICD-10-CM | POA: Diagnosis not present

## 2016-03-29 DIAGNOSIS — O09523 Supervision of elderly multigravida, third trimester: Secondary | ICD-10-CM | POA: Insufficient documentation

## 2016-03-30 ENCOUNTER — Other Ambulatory Visit (HOSPITAL_COMMUNITY): Payer: Self-pay | Admitting: *Deleted

## 2016-03-30 DIAGNOSIS — O09529 Supervision of elderly multigravida, unspecified trimester: Secondary | ICD-10-CM

## 2016-04-07 ENCOUNTER — Ambulatory Visit (HOSPITAL_COMMUNITY): Payer: 59

## 2016-04-10 ENCOUNTER — Other Ambulatory Visit (HOSPITAL_COMMUNITY): Payer: Self-pay | Admitting: *Deleted

## 2016-04-10 DIAGNOSIS — O09529 Supervision of elderly multigravida, unspecified trimester: Secondary | ICD-10-CM

## 2016-04-11 DIAGNOSIS — Z3493 Encounter for supervision of normal pregnancy, unspecified, third trimester: Secondary | ICD-10-CM | POA: Diagnosis not present

## 2016-04-18 DIAGNOSIS — Z3480 Encounter for supervision of other normal pregnancy, unspecified trimester: Secondary | ICD-10-CM | POA: Diagnosis not present

## 2016-04-18 DIAGNOSIS — Z3493 Encounter for supervision of normal pregnancy, unspecified, third trimester: Secondary | ICD-10-CM | POA: Diagnosis not present

## 2016-04-26 ENCOUNTER — Encounter (HOSPITAL_COMMUNITY): Payer: Self-pay

## 2016-04-26 ENCOUNTER — Ambulatory Visit (HOSPITAL_COMMUNITY)
Admission: RE | Admit: 2016-04-26 | Discharge: 2016-04-26 | Disposition: A | Discharge status: Home / Self Care | Payer: 59 | Source: Ambulatory Visit | Attending: Obstetrics | Admitting: Obstetrics

## 2016-04-26 DIAGNOSIS — O09523 Supervision of elderly multigravida, third trimester: Secondary | ICD-10-CM | POA: Diagnosis not present

## 2016-04-26 DIAGNOSIS — O283 Abnormal ultrasonic finding on antenatal screening of mother: Secondary | ICD-10-CM | POA: Diagnosis not present

## 2016-04-26 DIAGNOSIS — Z3A33 33 weeks gestation of pregnancy: Secondary | ICD-10-CM | POA: Diagnosis not present

## 2016-04-26 DIAGNOSIS — O09529 Supervision of elderly multigravida, unspecified trimester: Secondary | ICD-10-CM

## 2016-04-27 ENCOUNTER — Other Ambulatory Visit (HOSPITAL_COMMUNITY): Payer: Self-pay | Admitting: *Deleted

## 2016-04-27 ENCOUNTER — Ambulatory Visit (INDEPENDENT_AMBULATORY_CARE_PROVIDER_SITE_OTHER): Payer: 59 | Admitting: Obstetrics

## 2016-04-27 ENCOUNTER — Encounter: Payer: Self-pay | Admitting: Obstetrics

## 2016-04-27 VITALS — BP 116/73 | HR 86 | Temp 98.5°F | Wt 166.3 lb

## 2016-04-27 DIAGNOSIS — O09529 Supervision of elderly multigravida, unspecified trimester: Secondary | ICD-10-CM

## 2016-04-27 DIAGNOSIS — Z3493 Encounter for supervision of normal pregnancy, unspecified, third trimester: Secondary | ICD-10-CM | POA: Diagnosis not present

## 2016-04-30 ENCOUNTER — Encounter: Payer: Self-pay | Admitting: *Deleted

## 2016-04-30 DIAGNOSIS — O280 Abnormal hematological finding on antenatal screening of mother: Secondary | ICD-10-CM

## 2016-04-30 DIAGNOSIS — O09529 Supervision of elderly multigravida, unspecified trimester: Secondary | ICD-10-CM

## 2016-05-03 ENCOUNTER — Encounter: Payer: Self-pay | Admitting: Obstetrics and Gynecology

## 2016-05-03 ENCOUNTER — Ambulatory Visit (INDEPENDENT_AMBULATORY_CARE_PROVIDER_SITE_OTHER): Payer: 59 | Admitting: Obstetrics and Gynecology

## 2016-05-03 VITALS — BP 118/72 | HR 92 | Temp 98.3°F | Wt 170.0 lb

## 2016-05-03 DIAGNOSIS — Z1389 Encounter for screening for other disorder: Secondary | ICD-10-CM

## 2016-05-03 DIAGNOSIS — Z3493 Encounter for supervision of normal pregnancy, unspecified, third trimester: Secondary | ICD-10-CM

## 2016-05-03 DIAGNOSIS — O99810 Abnormal glucose complicating pregnancy: Secondary | ICD-10-CM | POA: Diagnosis not present

## 2016-05-03 DIAGNOSIS — O09523 Supervision of elderly multigravida, third trimester: Secondary | ICD-10-CM

## 2016-05-03 DIAGNOSIS — Z331 Pregnant state, incidental: Secondary | ICD-10-CM | POA: Diagnosis not present

## 2016-05-03 DIAGNOSIS — O0993 Supervision of high risk pregnancy, unspecified, third trimester: Secondary | ICD-10-CM | POA: Diagnosis not present

## 2016-05-03 DIAGNOSIS — O09893 Supervision of other high risk pregnancies, third trimester: Secondary | ICD-10-CM | POA: Insufficient documentation

## 2016-05-03 LAB — POCT URINALYSIS DIPSTICK
Bilirubin, UA: NEGATIVE
Glucose, UA: NEGATIVE
KETONES UA: NEGATIVE
Nitrite, UA: NEGATIVE
PH UA: 6
PROTEIN UA: NEGATIVE
RBC UA: NEGATIVE
SPEC GRAV UA: 1.025
UROBILINOGEN UA: 0.2

## 2016-05-03 NOTE — Progress Notes (Signed)
Prenatal Visit Note Date: 05/03/2016 Clinic: [facility]  Subjective:  Elizabeth Hamilton is a 45 y.o. [id number] at [redacted]w[redacted]d being seen today for ongoing prenatal care.  She is currently monitored for the following issues for this high-risk pregnancy and has Maternal age 68+, multigravida, antepartum; Prior poor obstetrical history in second trimester, antepartum; Abnormal maternal serum screening test; Supervision of high risk pregnancy in third trimester; and Short interval between pregnancies affecting pregnancy in third trimester, antepartum on her problem list.  Patient reports no complaints.   Contractions: Not present. Vag. Bleeding: None.  Movement: Present. Denies leaking of fluid.   The following portions of the patient's history were reviewed and updated as appropriate: allergies, current medications, past family history, past medical history, past social history, past surgical history and problem list. Problem list updated.  Objective:   Filed Vitals:   05/03/16 1559  BP: 118/72  Pulse: 92  Temp: 98.3 F (36.8 C)  Weight: 170 lb (77.111 kg)    Fetal Status:     Movement: Present     General:  Alert, oriented and cooperative. Patient is in no acute distress.  Skin: Skin is warm and dry. No rash noted.   Cardiovascular: Normal heart rate noted  Respiratory: Normal respiratory effort, no problems with respiration noted  Abdomen: Soft, gravid, appropriate for gestational age. Pain/Pressure: Absent     Pelvic:  Cervical exam deferred        Extremities: Normal range of motion.  Edema: None  Mental Status: Normal mood and affect. Normal behavior. Normal judgment and thought content.   Urinalysis:      Assessment and Plan:  Pregnancy: [id number] at [redacted]w[redacted]d  1. Prenatal care, third trimester DATES CHANGED. Patient told that based on her LMP and first ever u/s at 12wks that her [place] should be 8/20 with LMP=12wk u/s so she is 34/3 weeks today. Patient desires ppBTL. 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 Normal 3hr GTT and neg RPR; rest of her 3rd trimester labs ordered - CBC - HIV antibody (with reflex)  2. AMA Confirms shows and patient confirms that she already has an MFM growth u/s already scheduled as well as antenatal testing given AMA over age 87.  S/p panorama testing (low risk) for abnormal quad screen. Anatomy scan showed EIF. Seen by MFM GC earlier in pregnancy.   Preterm labor symptoms and general obstetric precautions including but not limited to vaginal bleeding, contractions, leaking of fluid and fetal movement were reviewed in detail with the patient. Please refer to After Visit Summary for other counseling recommendations.  Return in about 2 weeks (around 05/17/2016).   Aletha Halim, MD

## 2016-05-04 ENCOUNTER — Inpatient Hospital Stay (HOSPITAL_COMMUNITY)
Admission: AD | Admit: 2016-05-04 | Discharge: 2016-05-04 | Disposition: A | Discharge status: Home / Self Care | Payer: 59 | Source: Ambulatory Visit | Attending: Obstetrics & Gynecology | Admitting: Obstetrics & Gynecology

## 2016-05-04 ENCOUNTER — Encounter: Payer: Self-pay | Admitting: Obstetrics

## 2016-05-04 DIAGNOSIS — O99613 Diseases of the digestive system complicating pregnancy, third trimester: Secondary | ICD-10-CM | POA: Diagnosis not present

## 2016-05-04 DIAGNOSIS — O3463 Maternal care for abnormality of vagina, third trimester: Secondary | ICD-10-CM

## 2016-05-04 DIAGNOSIS — O09513 Supervision of elderly primigravida, third trimester: Secondary | ICD-10-CM | POA: Diagnosis not present

## 2016-05-04 DIAGNOSIS — N7689 Other specified inflammation of vagina and vulva: Secondary | ICD-10-CM | POA: Insufficient documentation

## 2016-05-04 DIAGNOSIS — O23593 Infection of other part of genital tract in pregnancy, third trimester: Secondary | ICD-10-CM | POA: Insufficient documentation

## 2016-05-04 DIAGNOSIS — K5900 Constipation, unspecified: Secondary | ICD-10-CM | POA: Diagnosis not present

## 2016-05-04 DIAGNOSIS — Z3A34 34 weeks gestation of pregnancy: Secondary | ICD-10-CM | POA: Insufficient documentation

## 2016-05-04 DIAGNOSIS — R1909 Other intra-abdominal and pelvic swelling, mass and lump: Secondary | ICD-10-CM | POA: Diagnosis present

## 2016-05-04 LAB — CBC
HEMATOCRIT: 34.8 % (ref 34.0–46.6)
HEMOGLOBIN: 11.3 g/dL (ref 11.1–15.9)
MCH: 28.2 pg (ref 26.6–33.0)
MCHC: 32.5 g/dL (ref 31.5–35.7)
MCV: 87 fL (ref 79–97)
Platelets: 259 10*3/uL (ref 150–379)
RBC: 4.01 x10E6/uL (ref 3.77–5.28)
RDW: 14.4 % (ref 12.3–15.4)
WBC: 10.2 10*3/uL (ref 3.4–10.8)

## 2016-05-04 LAB — HIV ANTIBODY (ROUTINE TESTING W REFLEX): HIV Screen 4th Generation wRfx: NONREACTIVE

## 2016-05-04 MED ORDER — FLEET ENEMA 7-19 GM/118ML RE ENEM: 1.0000 | ENEMA | Freq: Once | RECTAL | Status: DC

## 2016-05-04 MED ORDER — POLYETHYLENE GLYCOL 3350 17 G PO PACK: 17.0000 g | PACK | Freq: Every day | ORAL | Status: DC

## 2016-05-04 NOTE — MAU Provider Note (Signed)
Chief Complaint:  Groin Swelling   First Provider Initiated Contact with Patient 05/04/16 0858      Elizabeth Hamilton is a 45 y.o. [id number] at 6w4dwho presents to maternity admissions reporting vaginal swelling since this morning.  Had constipation and spent an hour on the toilet pushing to have a bowel movement.  Then noticed "something falling out".  No bleeding or abdominal pain. Tender to touch. She reports good fetal movement, denies LOF, vaginal bleeding, vaginal itching/burning, urinary symptoms, h/a, dizziness, n/v, diarrhea, constipation or fever/chills.  She denies headache, visual changes or RUQ abdominal pain.   Vaginal Pain The patient's pertinent negatives include no genital itching, genital lesions, genital odor, pelvic pain or vaginal bleeding. Primary symptoms comment: "something hanging out". This is a new problem. The current episode started today. The problem occurs constantly. The problem has been unchanged. The pain is mild. She is pregnant. Pertinent negatives include no abdominal pain, back pain, chills, diarrhea, fever, headaches, nausea or vomiting. The symptoms are aggravated by tactile pressure. She has tried nothing for the symptoms.   Past Medical History: Past Medical History  Diagnosis Date  . Medical history non-contributory     Past obstetric history: OB History  Gravida Para Term Preterm AB SAB TAB Ectopic Multiple Living  6 3 3  2 2    0 5    # Outcome Date GA Lbr Len/2nd Weight Sex Delivery Anes PTL Lv  6 Current           5 Term 12/26/14 [redacted]w[redacted]d 18:01 / 00:43 8 lb (3.629 kg) [address] EPI  Y  4 Term 01/15/06 [redacted]w[redacted]d  6 lb (2.722 kg) F Vag-Spont  N Y  3 SAB 03/23/05          2 SAB 10/23/04          1 Term 08/17/03 [redacted]w[redacted]d  6 lb (2.722 kg) F Vag-Spont   Y      Past Surgical History: Past Surgical History  Procedure Laterality Date  . No past surgeries      Family History: Family History  Problem Relation Age of Onset  . Alcohol abuse Neg Hx   .  Arthritis Neg Hx   . Asthma Neg Hx   . Birth defects Neg Hx   . Cancer Neg Hx   . COPD Neg Hx   . Depression Neg Hx   . Drug abuse Neg Hx   . Early death Neg Hx   . Hearing loss Neg Hx   . Heart disease Neg Hx   . Hyperlipidemia Neg Hx   . Hypertension Neg Hx   . Kidney disease Neg Hx   . Learning disabilities Neg Hx   . Mental illness Neg Hx   . Mental retardation Neg Hx   . Miscarriages / Stillbirths Neg Hx   . Stroke Neg Hx   . Vision loss Neg Hx   . Varicose Veins Neg Hx   . Diabetes Mother   . Diabetes Father   . Parkinson's disease Father     Social History: Social History  Substance Use Topics  . Smoking status: Never Smoker   . Smokeless tobacco: Never Used  . Alcohol Use: No    Allergies: No Known Allergies  Meds:  No prescriptions prior to admission    I have reviewed patient's Past Medical Hx, Surgical Hx, Family Hx, Social Hx, medications and allergies.   ROS:  Review of Systems  Constitutional: Negative for fever and chills.  Gastrointestinal: Negative  for nausea, vomiting, abdominal pain and diarrhea.  Genitourinary: Positive for vaginal pain. Negative for pelvic pain.  Musculoskeletal: Negative for back pain.  Neurological: Negative for headaches.   Other systems negative  Physical Exam  Patient Vitals for the past 24 hrs:  BP Temp Pulse Resp  05/04/16 0935 114/73 mmHg 98.5 F (36.9 C) 89 16  05/04/16 0825 114/69 mmHg 97.8 F (36.6 C) 93 18   Constitutional: Well-developed, well-nourished female in no acute distress.  Cardiovascular: normal rate and rhythm Respiratory: normal effort, clear to auscultation bilaterally GI: Abd soft, non-tender, gravid appropriate for gestational age.   No rebound or guarding. MS: Extremities nontender, no edema, normal ROM Neurologic: Alert and oriented x 4.  GU: Neg CVAT.  PELVIC EXAM:  Speculum inserted.  Swelling appears to be part of posterior vaginal wall.  Superficial mucosa of post vagina is  edematous.  Sidewalls and anterior vagina are normal.  Cervix is long and closed.  This tissue is the outer 1/3 of posterior vagina.  Not friable.  Purely edematous.         FHT:  Baseline 140 , moderate variability, accelerations present, no decelerations Contractions:  Rare   Labs: Results for orders placed or performed in visit on 05/03/16 (from the past 24 hour(s))  POCT Urinalysis Dipstick     Status: Abnormal   Collection Time: 05/03/16  4:12 PM  Result Value Ref Range   Color, UA Yellow    Clarity, UA Clear    Glucose, UA Neg    Bilirubin, UA Neg    Ketones, UA Neg    Spec Grav, UA 1.025    Blood, UA Neg    pH, UA 6.0    Protein, UA Neg    Urobilinogen, UA 0.2    Nitrite, UA Neg    Leukocytes, UA moderate (2+) (A) Negative  CBC     Status: None   Collection Time: 05/03/16  5:32 PM  Result Value Ref Range   WBC 10.2 3.4 - 10.8 x10E3/uL   RBC 4.01 3.77 - 5.28 x10E6/uL   Hemoglobin 11.3 11.1 - 15.9 g/dL   Hematocrit 34.8 34.0 - 46.6 %   MCV 87 79 - 97 fL   MCH 28.2 26.6 - 33.0 pg   MCHC 32.5 31.5 - 35.7 g/dL   RDW 14.4 12.3 - 15.4 %   Platelets 259 150 - 379 x10E3/uL   Narrative   Performed at:  [facility] [address] [place], [place]  [id number] Lab Director: Lindon Romp MD, Phone:  [phone]  HIV antibody (with reflex)     Status: None   Collection Time: 05/03/16  5:32 PM  Result Value Ref Range   HIV Screen 4th Generation wRfx Non Reactive Non Reactive   Narrative   Performed at:  [facility] [address] [facility] [address] [place], [place]  [id number] Lab Director: Lindon Romp MD, Phone:  [phone]   A/Positive/-- (01/19 0000)  Imaging:  [place] Mfm Ob Follow Up  04/28/2016  OBSTETRICAL ULTRASOUND: This exam was performed within a [facility] Ultrasound Department. The OB US report was generated in the AS system, and faxed to the ordering physician.  This report is available in the [facility]. See the [facility] Obstetric US report via the  Image Link.   MAU Course/MDM: I have ordered labs and reviewed results.  NST reviewed I feel this is directly related to seated toilet position for prolonged time with straining. Advised cold packs and horizontal  bedrest for 1-2 days until swelling goes down.  Will treat constipation   Assessment: 1. Vaginal abnormality in pregnancy, antepartum, third trimester   2. Constipation, unspecified constipation type   3.   Posterior vaginal wall mucosal edema, profound  Plan: Discharge home Rx Miralax PRN Rx Fleet enema PRN Preterm  Labor precautions and fetal kick counts Follow up in Office for prenatal visits and recheck Encouraged to return here or to other Urgent Care/ED if she develops worsening of symptoms, increase in pain, fever, or other concerning symptoms.     Follow-up Information    Schedule an appointment as soon as possible for a visit with HARPER,CHARLES A, MD.   Specialty:  Obstetrics and Gynecology   Contact information:   [address] [place] [address] [phone]        Medication List    TAKE these medications        polyethylene glycol packet  Commonly known as:  MIRALAX  Take 17 g by mouth daily.     prenatal multivitamin Tabs tablet  Take 1 tablet by mouth daily at 12 noon.     sodium phosphate 7-19 GM/118ML Enem  Place 133 mLs (1 enema total) rectally once.       Pt stable at time of discharge.  Hansel Feinstein CNM, MSN Certified Nurse-Midwife 05/04/2016 12:48 PM

## 2016-05-04 NOTE — MAU Note (Signed)
Urine in lab 

## 2016-05-04 NOTE — Progress Notes (Signed)
Subjective:    Elizabeth Hamilton. is a 45 y.o. female being seen today for her obstetrical visit. She is at [redacted]w[redacted]d gestation. Patient reports no complaints. Fetal movement: normal.  Problem List Items Addressed This Visit    None     Patient Active Problem List   Diagnosis Date Noted  . Supervision of high risk pregnancy in third trimester 05/03/2016  . Short interval between pregnancies affecting pregnancy in third trimester, antepartum 05/03/2016  . Abnormal glucose affecting pregnancy 05/03/2016  . Abnormal maternal serum screening test 01/21/2016  . Maternal age 22+, multigravida, antepartum    Objective:    BP 116/73 mmHg  Pulse 86  Temp(Src) 98.5 F (36.9 C)  Wt 166 lb 4.8 oz (75.433 kg)  LMP 09/05/2015 FHT:  150 BPM  Uterine Size: size equals dates  Presentation: unsure     Assessment:    Pregnancy @ [redacted]w[redacted]d weeks   Plan:     labs reviewed, problem list updated Consent signed. GBS sent TDAP offered  Rhogam given for RH negative Pediatrician: discussed. Infant feeding: plans to breastfeed. Maternity leave: discussed. Cigarette smoking: never smoked. No orders of the defined types were placed in this encounter.   No orders of the defined types were placed in this encounter.   Follow up in 1 Week.

## 2016-05-04 NOTE — Discharge Instructions (Signed)

## 2016-05-04 NOTE — MAU Note (Signed)
Pt states that she has been feeling extremely constipated since 0300 but successfully had a bowel movement. Pt states that after straining with her bowel movement she felt something "bulging" in her vagina. With self inspection she noticed a red, bulging piece of tissue which is around 3 inches in size. Pt denies any vaginal bleeding, leaking fluid, or contractions. Pt states the discomfort is a 4/10. Toya Smothers, RN

## 2016-05-04 NOTE — MAU Note (Signed)
Pt stated she was constipated. Strained and felt and looked at her vaginal and something is coming out of it.

## 2016-05-08 ENCOUNTER — Ambulatory Visit (HOSPITAL_COMMUNITY): Payer: 59

## 2016-05-09 ENCOUNTER — Ambulatory Visit (HOSPITAL_COMMUNITY): Payer: 59

## 2016-05-11 ENCOUNTER — Ambulatory Visit (HOSPITAL_COMMUNITY): Payer: 59

## 2016-05-12 ENCOUNTER — Other Ambulatory Visit (HOSPITAL_COMMUNITY): Payer: 59

## 2016-05-12 ENCOUNTER — Ambulatory Visit (HOSPITAL_COMMUNITY): Payer: 59

## 2016-05-15 ENCOUNTER — Ambulatory Visit (HOSPITAL_COMMUNITY): Payer: 59

## 2016-05-15 ENCOUNTER — Ambulatory Visit (INDEPENDENT_AMBULATORY_CARE_PROVIDER_SITE_OTHER): Payer: 59 | Admitting: Obstetrics and Gynecology

## 2016-05-15 VITALS — BP 127/78 | HR 92 | Wt 171.0 lb

## 2016-05-15 DIAGNOSIS — O09893 Supervision of other high risk pregnancies, third trimester: Secondary | ICD-10-CM

## 2016-05-15 DIAGNOSIS — O0993 Supervision of high risk pregnancy, unspecified, third trimester: Secondary | ICD-10-CM

## 2016-05-15 DIAGNOSIS — O289 Unspecified abnormal findings on antenatal screening of mother: Secondary | ICD-10-CM

## 2016-05-15 DIAGNOSIS — O09523 Supervision of elderly multigravida, third trimester: Secondary | ICD-10-CM | POA: Diagnosis not present

## 2016-05-15 DIAGNOSIS — O99810 Abnormal glucose complicating pregnancy: Secondary | ICD-10-CM | POA: Diagnosis not present

## 2016-05-15 DIAGNOSIS — O280 Abnormal hematological finding on antenatal screening of mother: Secondary | ICD-10-CM

## 2016-05-15 NOTE — Progress Notes (Signed)
Prenatal Visit Note Date: 05/15/2016 Clinic: [facility]  Subjective:  Elizabeth Hamilton is a 45 y.o. [id number] at [redacted]w[redacted]d being seen today for ongoing prenatal care.  She is currently monitored for the following issues for this high-risk pregnancy and has Maternal age 43+, multigravida, antepartum; Abnormal maternal serum screening test; Supervision of high risk pregnancy in third trimester; Short interval between pregnancies affecting pregnancy in third trimester, antepartum; and Abnormal glucose affecting pregnancy on her problem list.  Patient reports no complaints.   Contractions: Not present. Vag. Bleeding: None.  Movement: Present. Denies leaking of fluid.   The following portions of the patient's history were reviewed and updated as appropriate: allergies, current medications, past family history, past medical history, past social history, past surgical history and problem list. Problem list updated.  Objective:   Vitals:   05/15/16 1601  BP: 127/78  Pulse: 92  Weight: 171 lb (77.6 kg)    Fetal Status:     Movement: Present     General:  Alert, oriented and cooperative. Patient is in no acute distress.  Skin: Skin is warm and dry. No rash noted.   Cardiovascular: Normal heart rate noted  Respiratory: Normal respiratory effort, no problems with respiration noted  Abdomen: Soft, gravid, appropriate for gestational age. Pain/Pressure: Absent     Pelvic:  Cervical exam deferred        Extremities: Normal range of motion.  Edema: Trace  Mental Status: Normal mood and affect. Normal behavior. Normal judgment and thought content.   Urinalysis:      Assessment and Plan:  Pregnancy: [id number] at [redacted]w[redacted]d  1. Supervision of high risk pregnancy in third trimester GBS, GC/CT today Desires BTL (private insurance)  2. Short interval between pregnancies affecting pregnancy in third trimester, antepartum Active management of 3rd stage  3. Abnormal maternal serum screening test Confirms shows  and patient confirms that she already has an MFM growth u/s already scheduled as well as antenatal testing given AMA over age 62.  S/p panorama testing (low risk) for abnormal quad screen. Anatomy scan showed EIF. Seen by MFM GC earlier in pregnancy.   4. Maternal age 3+, multigravida, antepartum, third trimester Start AP testing at MFM later this week Delivery by [facility] rNST today (done b/c of scheduling mix up)  5. Abnormal glucose affecting pregnancy Normal 3hr GTT Normal growth on 7/5 with normal AC; follow up growth next week  Preterm labor symptoms and general obstetric precautions including but not limited to vaginal bleeding, contractions, leaking of fluid and fetal movement were reviewed in detail with the patient. Please refer to After Visit Summary for other counseling recommendations.  1wk RTC  Aletha Halim, MD

## 2016-05-15 NOTE — Addendum Note (Signed)
Addended by: Phill Myron on: 05/15/2016 04:52 PM   Modules accepted: Orders

## 2016-05-16 ENCOUNTER — Ambulatory Visit (HOSPITAL_COMMUNITY): Payer: 59

## 2016-05-16 ENCOUNTER — Encounter (HOSPITAL_COMMUNITY): Payer: Self-pay

## 2016-05-16 LAB — GC/CHLAMYDIA PROBE AMP
CHLAMYDIA, DNA PROBE: NEGATIVE
Neisseria gonorrhoeae by PCR: NEGATIVE

## 2016-05-17 ENCOUNTER — Ambulatory Visit (HOSPITAL_COMMUNITY)
Admission: RE | Admit: 2016-05-17 | Discharge: 2016-05-17 | Disposition: A | Discharge status: Home / Self Care | Payer: 59 | Source: Ambulatory Visit | Attending: Obstetrics | Admitting: Obstetrics

## 2016-05-17 ENCOUNTER — Encounter (HOSPITAL_COMMUNITY): Payer: Self-pay

## 2016-05-17 ENCOUNTER — Other Ambulatory Visit (HOSPITAL_COMMUNITY): Payer: Self-pay | Admitting: Maternal and Fetal Medicine

## 2016-05-17 DIAGNOSIS — Z3A36 36 weeks gestation of pregnancy: Secondary | ICD-10-CM

## 2016-05-17 DIAGNOSIS — O09523 Supervision of elderly multigravida, third trimester: Secondary | ICD-10-CM

## 2016-05-17 DIAGNOSIS — O289 Unspecified abnormal findings on antenatal screening of mother: Secondary | ICD-10-CM

## 2016-05-17 DIAGNOSIS — O09529 Supervision of elderly multigravida, unspecified trimester: Secondary | ICD-10-CM

## 2016-05-17 DIAGNOSIS — O283 Abnormal ultrasonic finding on antenatal screening of mother: Secondary | ICD-10-CM | POA: Diagnosis not present

## 2016-05-17 DIAGNOSIS — O288 Other abnormal findings on antenatal screening of mother: Secondary | ICD-10-CM | POA: Diagnosis not present

## 2016-05-17 DIAGNOSIS — O280 Abnormal hematological finding on antenatal screening of mother: Secondary | ICD-10-CM

## 2016-05-18 ENCOUNTER — Ambulatory Visit (HOSPITAL_COMMUNITY): Payer: 59

## 2016-05-19 ENCOUNTER — Other Ambulatory Visit (HOSPITAL_COMMUNITY): Payer: 59

## 2016-05-19 ENCOUNTER — Ambulatory Visit (HOSPITAL_COMMUNITY): Payer: 59

## 2016-05-19 LAB — CULTURE, BETA STREP (GROUP B ONLY): Strep Gp B Culture: NEGATIVE

## 2016-05-22 ENCOUNTER — Ambulatory Visit (HOSPITAL_COMMUNITY): Payer: 59

## 2016-05-22 ENCOUNTER — Ambulatory Visit (INDEPENDENT_AMBULATORY_CARE_PROVIDER_SITE_OTHER): Payer: 59 | Admitting: Obstetrics & Gynecology

## 2016-05-22 VITALS — BP 111/71 | HR 97 | Temp 98.3°F | Wt 173.3 lb

## 2016-05-22 DIAGNOSIS — O09523 Supervision of elderly multigravida, third trimester: Secondary | ICD-10-CM

## 2016-05-22 DIAGNOSIS — Z331 Pregnant state, incidental: Secondary | ICD-10-CM | POA: Diagnosis not present

## 2016-05-22 DIAGNOSIS — Z1389 Encounter for screening for other disorder: Secondary | ICD-10-CM

## 2016-05-22 DIAGNOSIS — O0993 Supervision of high risk pregnancy, unspecified, third trimester: Secondary | ICD-10-CM

## 2016-05-22 LAB — POCT URINALYSIS DIPSTICK
Bilirubin, UA: NEGATIVE
Glucose, UA: NEGATIVE
Ketones, UA: NEGATIVE
Leukocytes, UA: NEGATIVE
NITRITE UA: NEGATIVE
PH UA: 5
PROTEIN UA: NEGATIVE
RBC UA: NEGATIVE
SPEC GRAV UA: 1.01
UROBILINOGEN UA: 0.2

## 2016-05-22 NOTE — Patient Instructions (Signed)
Return to clinic for any scheduled appointments or obstetric concerns, or go to MAU for evaluation  Thank you for enrolling in [place]. Please follow the instructions below to securely access your online medical record. MyChart allows you to send messages to your doctor, view your test results, renew your prescriptions, schedule appointments, and more.  How Do I Sign Up? 1. In your Internet browser, go to [email or web address]. 2. Click on the New  User? link in the Sign In box.  3. Enter your MyChart Access Code exactly as it appears below. You will not need to use this code after you have completed the sign-up process. If you do not sign up before the expiration date, you must request a new code. MyChart Access Code: [email or web address] Expires: 06/23/2016  9:48 AM  4. Enter the last four digits of your Social Security Number (xxxx) and Date of Birth (mm/dd/yyyy) as indicated and click Next. You will be taken to the next sign-up page. 5. Create a MyChart ID. This will be your MyChart login ID and cannot be changed, so think of one that is secure and easy to remember. 6. Create a MyChart password. You can change your password at any time. 7. Enter your Password Reset Question and Answer and click Next. This can be used at a later time if you forget your password.  8. Select your communication preference, and if applicable enter your e-mail address. You will receive e-mail notification when new information is available in MyChart by choosing to receive e-mail notifications and filling in your e-mail. 9. Click Sign In. You can now view your medical record.   Additional Information If you have questions, you can email [email or web address] or call [phone] to talk to our [facility] staff. Remember, MyChart is NOT to be used for urgent needs. For medical emergencies, dial 911.

## 2016-05-22 NOTE — Progress Notes (Signed)
Subjective:  Elizabeth Hamilton. is a 45 y.o. [id number] at [redacted]w[redacted]d being seen today for ongoing prenatal care.  She is currently monitored for the following issues for this high-risk pregnancy and has Maternal age 68+, multigravida, antepartum; Abnormal maternal serum screening test; Supervision of high risk pregnancy in third trimester; Short interval between pregnancies affecting pregnancy in third trimester, antepartum; and Abnormal glucose affecting pregnancy on her problem list.  Patient reports irregular contractions, pain/pressure in vagina.  Contractions: Irregular. Vag. Bleeding: None.  Movement: Present. Denies leaking of fluid.   The following portions of the patient's history were reviewed and updated as appropriate: allergies, current medications, past family history, past medical history, past social history, past surgical history and problem list. Problem list updated.  Objective:   Vitals:   05/22/16 1612  BP: 111/71  Pulse: 97  Temp: 98.3 F (36.8 C)  Weight: 173 lb 4.8 oz (78.6 kg)    Fetal Status: Fetal Heart Rate (bpm): 155 Fundal Height: 37 cm Movement: Present     General:  Alert, oriented and cooperative. Patient is in no acute distress.  Skin: Skin is warm and dry. No rash noted.   Cardiovascular: Normal heart rate noted  Respiratory: Normal respiratory effort, no problems with respiration noted  Abdomen: Soft, gravid, appropriate for gestational age. Pain/Pressure: Present     Pelvic:  Cervical exam deferred by patient        Extremities: Normal range of motion.  Edema: None  Mental Status: Normal mood and affect. Normal behavior. Normal judgment and thought content.   Urinalysis: Urine Protein: Negative Urine Glucose: Negative  Assessment and Plan:  Pregnancy: [id number] at [redacted]w[redacted]d  1. Maternal age 5+, multigravida, antepartum, third trimester Continue weekly BPP at MFM. IOL by [facility].  2. Supervision of high risk pregnancy in third trimester Term labor symptoms and  general obstetric precautions including but not limited to vaginal bleeding, contractions, leaking of fluid and fetal movement were reviewed in detail with the patient. Please refer to After Visit Summary for other counseling recommendations.  Return for OB Visit.   Osborne Oman, MD

## 2016-05-23 ENCOUNTER — Encounter (HOSPITAL_COMMUNITY): Payer: Self-pay

## 2016-05-23 ENCOUNTER — Ambulatory Visit (HOSPITAL_COMMUNITY): Payer: 59

## 2016-05-24 ENCOUNTER — Encounter (HOSPITAL_COMMUNITY): Payer: Self-pay

## 2016-05-24 ENCOUNTER — Ambulatory Visit (HOSPITAL_COMMUNITY)
Admission: RE | Admit: 2016-05-24 | Discharge: 2016-05-24 | Disposition: A | Discharge status: Home / Self Care | Payer: 59 | Source: Ambulatory Visit | Attending: Obstetrics | Admitting: Obstetrics

## 2016-05-24 DIAGNOSIS — O09523 Supervision of elderly multigravida, third trimester: Secondary | ICD-10-CM | POA: Insufficient documentation

## 2016-05-24 DIAGNOSIS — O09529 Supervision of elderly multigravida, unspecified trimester: Secondary | ICD-10-CM

## 2016-05-24 DIAGNOSIS — O283 Abnormal ultrasonic finding on antenatal screening of mother: Secondary | ICD-10-CM | POA: Diagnosis not present

## 2016-05-24 DIAGNOSIS — Z3A37 37 weeks gestation of pregnancy: Secondary | ICD-10-CM | POA: Diagnosis not present

## 2016-05-25 ENCOUNTER — Ambulatory Visit (HOSPITAL_COMMUNITY): Payer: 59

## 2016-05-26 ENCOUNTER — Other Ambulatory Visit (HOSPITAL_COMMUNITY): Payer: 59

## 2016-05-26 ENCOUNTER — Ambulatory Visit (HOSPITAL_COMMUNITY): Payer: 59

## 2016-05-29 ENCOUNTER — Ambulatory Visit (HOSPITAL_COMMUNITY): Payer: 59

## 2016-05-29 ENCOUNTER — Inpatient Hospital Stay (HOSPITAL_COMMUNITY)
Admission: AD | Admit: 2016-05-29 | Discharge: 2016-05-29 | Disposition: A | Discharge status: Home / Self Care | Payer: 59 | Source: Ambulatory Visit | Attending: Obstetrics & Gynecology | Admitting: Obstetrics & Gynecology

## 2016-05-29 ENCOUNTER — Encounter (HOSPITAL_COMMUNITY): Payer: Self-pay | Admitting: *Deleted

## 2016-05-29 DIAGNOSIS — O289 Unspecified abnormal findings on antenatal screening of mother: Secondary | ICD-10-CM | POA: Diagnosis not present

## 2016-05-29 DIAGNOSIS — Z3A38 38 weeks gestation of pregnancy: Secondary | ICD-10-CM | POA: Diagnosis not present

## 2016-05-29 DIAGNOSIS — O280 Abnormal hematological finding on antenatal screening of mother: Secondary | ICD-10-CM

## 2016-05-29 NOTE — MAU Note (Signed)
Pt reports worsening contractions this pm.denies bleeding or ROM

## 2016-05-30 ENCOUNTER — Ambulatory Visit (HOSPITAL_COMMUNITY): Payer: 59

## 2016-05-31 ENCOUNTER — Ambulatory Visit (HOSPITAL_COMMUNITY)
Admission: RE | Admit: 2016-05-31 | Discharge: 2016-05-31 | Disposition: A | Discharge status: Home / Self Care | Payer: 59 | Source: Ambulatory Visit | Attending: Obstetrics | Admitting: Obstetrics

## 2016-05-31 ENCOUNTER — Ambulatory Visit (INDEPENDENT_AMBULATORY_CARE_PROVIDER_SITE_OTHER): Payer: 59 | Admitting: Obstetrics & Gynecology

## 2016-05-31 ENCOUNTER — Encounter (HOSPITAL_COMMUNITY): Payer: Self-pay

## 2016-05-31 VITALS — BP 111/73 | HR 95 | Wt 181.0 lb

## 2016-05-31 DIAGNOSIS — Z3A38 38 weeks gestation of pregnancy: Secondary | ICD-10-CM | POA: Diagnosis not present

## 2016-05-31 DIAGNOSIS — O280 Abnormal hematological finding on antenatal screening of mother: Secondary | ICD-10-CM

## 2016-05-31 DIAGNOSIS — O0993 Supervision of high risk pregnancy, unspecified, third trimester: Secondary | ICD-10-CM

## 2016-05-31 DIAGNOSIS — O09523 Supervision of elderly multigravida, third trimester: Secondary | ICD-10-CM | POA: Diagnosis not present

## 2016-05-31 DIAGNOSIS — O99810 Abnormal glucose complicating pregnancy: Secondary | ICD-10-CM

## 2016-05-31 DIAGNOSIS — O09529 Supervision of elderly multigravida, unspecified trimester: Secondary | ICD-10-CM | POA: Insufficient documentation

## 2016-05-31 DIAGNOSIS — O09893 Supervision of other high risk pregnancies, third trimester: Secondary | ICD-10-CM | POA: Diagnosis not present

## 2016-05-31 DIAGNOSIS — O289 Unspecified abnormal findings on antenatal screening of mother: Secondary | ICD-10-CM

## 2016-05-31 DIAGNOSIS — Z3493 Encounter for supervision of normal pregnancy, unspecified, third trimester: Secondary | ICD-10-CM

## 2016-05-31 NOTE — Progress Notes (Signed)
Subjective:  Elizabeth Hamilton. is a 45 y.o. [id number] at [redacted]w[redacted]d being seen today for ongoing prenatal care.  She is currently monitored for the following issues for this high-risk pregnancy and has Maternal age 36+, multigravida, antepartum; Abnormal maternal serum screening test; Supervision of high risk pregnancy in third trimester; Short interval between pregnancies affecting pregnancy in third trimester, antepartum; and Abnormal glucose affecting pregnancy on her problem list.  Patient reports painful ctx that occur at least once an hour..  Contractions: Regular. Vag. Bleeding: None.  Movement: Present. Denies leaking of fluid.   The following portions of the patient's history were reviewed and updated as appropriate: allergies, current medications, past family history, past medical history, past social history, past surgical history and problem list. Problem list updated.  Objective:   Vitals:   05/31/16 1026  BP: 111/73  Pulse: 95  Weight: 181 lb (82.1 kg)    Fetal Status: Fetal Heart Rate (bpm): 157   Movement: Present     General:  Alert, oriented and cooperative. Patient is in no acute distress.  Skin: Skin is warm and dry. No rash noted.   Cardiovascular: Normal heart rate noted  Respiratory: Normal respiratory effort, no problems with respiration noted  Abdomen: Soft, gravid, appropriate for gestational age. Pain/Pressure: Present     Pelvic:  Cervical exam performed        Extremities: Normal range of motion.  Edema: None  Mental Status: Normal mood and affect. Normal behavior. Normal judgment and thought content.   Urinalysis: Urine Protein: Negative Urine Glucose: Negative  Assessment and Plan:  Pregnancy: [id number] at [redacted]w[redacted]d  1. Prenatal care, third trimester  - POCT Urinalysis Dipstick  2. Supervision of high risk pregnancy in third trimester Probable latent phase labor    3. Short interval between pregnancies affecting pregnancy in third trimester, antepartum  4.  Maternal age 66+, multigravida, antepartum, third trimester Deliver by [redacted] weeks gestation  5. Abnormal maternal serum screening test S/p Panorama that was 'low risk'  6. Abnormal glucose affecting pregnancy normal 3 hour GTT  Term labor symptoms and general obstetric precautions including but not limited to vaginal bleeding, contractions, leaking of fluid and fetal movement were reviewed in detail with the patient. Please refer to After Visit Summary for other counseling recommendations.  Return in about 1 week (around 06/07/2016).   Lavonia Drafts, MD

## 2016-05-31 NOTE — Patient Instructions (Signed)

## 2016-06-01 ENCOUNTER — Ambulatory Visit (HOSPITAL_COMMUNITY): Payer: 59

## 2016-06-02 ENCOUNTER — Ambulatory Visit (HOSPITAL_COMMUNITY): Payer: 59

## 2016-06-02 ENCOUNTER — Other Ambulatory Visit (HOSPITAL_COMMUNITY): Payer: 59

## 2016-06-03 ENCOUNTER — Inpatient Hospital Stay (HOSPITAL_COMMUNITY)
Admission: AD | Admit: 2016-06-03 | Discharge: 2016-06-03 | Disposition: A | Discharge status: Home / Self Care | Payer: 59 | Source: Ambulatory Visit | Attending: Obstetrics & Gynecology | Admitting: Obstetrics & Gynecology

## 2016-06-03 DIAGNOSIS — O280 Abnormal hematological finding on antenatal screening of mother: Secondary | ICD-10-CM

## 2016-06-03 DIAGNOSIS — O09523 Supervision of elderly multigravida, third trimester: Secondary | ICD-10-CM

## 2016-06-03 NOTE — Discharge Instructions (Signed)
Braxton Hicks Contractions °Contractions of the uterus can occur throughout pregnancy. Contractions are not always a sign that you are in labor.  °WHAT ARE BRAXTON HICKS CONTRACTIONS?  °Contractions that occur before labor are called Braxton Hicks contractions, or false labor. Toward the end of pregnancy (32-34 weeks), these contractions can develop more often and may become more forceful. This is not true labor because these contractions do not result in opening (dilatation) and thinning of the cervix. They are sometimes difficult to tell apart from true labor because these contractions can be forceful and people have different pain tolerances. You should not feel embarrassed if you go to the hospital with false labor. Sometimes, the only way to tell if you are in true labor is for your health care provider to look for changes in the cervix. °If there are no prenatal problems or other health problems associated with the pregnancy, it is completely safe to be sent home with false labor and await the onset of true labor. °HOW CAN YOU TELL THE DIFFERENCE BETWEEN TRUE AND FALSE LABOR? °False Labor °· The contractions of false labor are usually shorter and not as hard as those of true labor.   °· The contractions are usually irregular.   °· The contractions are often felt in the front of the lower abdomen and in the groin.   °· The contractions may go away when you walk around or change positions while lying down.   °· The contractions get weaker and are shorter lasting as time goes on.   °· The contractions do not usually become progressively stronger, regular, and closer together as with true labor.   °True Labor °· Contractions in true labor last 30-70 seconds, become very regular, usually become more intense, and increase in frequency.   °· The contractions do not go away with walking.   °· The discomfort is usually felt in the top of the uterus and spreads to the lower abdomen and low back.   °· True labor can be  determined by your health care provider with an exam. This will show that the cervix is dilating and getting thinner.   °WHAT TO REMEMBER °· Keep up with your usual exercises and follow other instructions given by your health care provider.   °· Take medicines as directed by your health care provider.   °· Keep your regular prenatal appointments.   °· Eat and drink lightly if you think you are going into labor.   °· If Braxton Hicks contractions are making you uncomfortable:   °¨ Change your position from lying down or resting to walking, or from walking to resting.   °¨ Sit and rest in a tub of warm water.   °¨ Drink 2-3 glasses of water. Dehydration may cause these contractions.   °¨ Do slow and deep breathing several times an hour.   °WHEN SHOULD I SEEK IMMEDIATE MEDICAL CARE? °Seek immediate medical care if: °· Your contractions become stronger, more regular, and closer together.   °· You have fluid leaking or gushing from your vagina.   °· You have a fever.   °· You pass blood-tinged mucus.   °· You have vaginal bleeding.   °· You have continuous abdominal pain.   °· You have low back pain that you never had before.   °· You feel your baby's head pushing down and causing pelvic pressure.   °· Your baby is not moving as much as it used to.   °  °This information is not intended to replace advice given to you by your health care provider. Make sure you discuss any questions you have with your health care   provider. °  °Document Released: 10/09/2005 Document Revised: 10/14/2013 Document Reviewed: 07/21/2013 °Elsevier Interactive Patient Education ©2016 Elsevier Inc. ° °

## 2016-06-03 NOTE — MAU Note (Signed)
Contractions since 0400. Denies LOF. Some bloody show since Thurs. 2-3cm and 75% on Weds.

## 2016-06-04 ENCOUNTER — Encounter (HOSPITAL_COMMUNITY)
Admission: AD | Disposition: A | Discharge status: Home / Self Care | Payer: Self-pay | Source: Ambulatory Visit | Attending: Obstetrics & Gynecology

## 2016-06-04 ENCOUNTER — Encounter (HOSPITAL_COMMUNITY): Payer: Self-pay | Admitting: Certified Registered Nurse Anesthetist

## 2016-06-04 ENCOUNTER — Inpatient Hospital Stay (HOSPITAL_COMMUNITY)
Admission: AD | Admit: 2016-06-04 | Discharge: 2016-06-05 | DRG: 775 | Disposition: A | Discharge status: Home / Self Care | Payer: 59 | Source: Ambulatory Visit | Attending: Obstetrics & Gynecology | Admitting: Obstetrics & Gynecology

## 2016-06-04 ENCOUNTER — Encounter (HOSPITAL_COMMUNITY): Payer: Self-pay | Admitting: *Deleted

## 2016-06-04 DIAGNOSIS — O09893 Supervision of other high risk pregnancies, third trimester: Secondary | ICD-10-CM

## 2016-06-04 DIAGNOSIS — Z833 Family history of diabetes mellitus: Secondary | ICD-10-CM

## 2016-06-04 DIAGNOSIS — Z820 Family history of epilepsy and other diseases of the nervous system: Secondary | ICD-10-CM | POA: Diagnosis not present

## 2016-06-04 DIAGNOSIS — Z3483 Encounter for supervision of other normal pregnancy, third trimester: Secondary | ICD-10-CM | POA: Diagnosis present

## 2016-06-04 DIAGNOSIS — O09529 Supervision of elderly multigravida, unspecified trimester: Secondary | ICD-10-CM

## 2016-06-04 DIAGNOSIS — O0993 Supervision of high risk pregnancy, unspecified, third trimester: Secondary | ICD-10-CM

## 2016-06-04 DIAGNOSIS — O99810 Abnormal glucose complicating pregnancy: Secondary | ICD-10-CM | POA: Diagnosis present

## 2016-06-04 DIAGNOSIS — O99814 Abnormal glucose complicating childbirth: Principal | ICD-10-CM | POA: Diagnosis present

## 2016-06-04 DIAGNOSIS — O280 Abnormal hematological finding on antenatal screening of mother: Secondary | ICD-10-CM | POA: Diagnosis present

## 2016-06-04 DIAGNOSIS — IMO0001 Reserved for inherently not codable concepts without codable children: Secondary | ICD-10-CM

## 2016-06-04 DIAGNOSIS — O0990 Supervision of high risk pregnancy, unspecified, unspecified trimester: Secondary | ICD-10-CM

## 2016-06-04 DIAGNOSIS — Z3A39 39 weeks gestation of pregnancy: Secondary | ICD-10-CM | POA: Diagnosis not present

## 2016-06-04 LAB — CBC
HCT: 33.8 % — ABNORMAL LOW (ref 36.0–46.0)
Hemoglobin: 11.1 g/dL — ABNORMAL LOW (ref 12.0–15.0)
MCH: 27.7 pg (ref 26.0–34.0)
MCHC: 32.8 g/dL (ref 30.0–36.0)
MCV: 84.3 fL (ref 78.0–100.0)
PLATELETS: 212 10*3/uL (ref 150–400)
RBC: 4.01 MIL/uL (ref 3.87–5.11)
RDW: 14.9 % (ref 11.5–15.5)
WBC: 14.5 10*3/uL — AB (ref 4.0–10.5)

## 2016-06-04 LAB — TYPE AND SCREEN
ABO/RH(D): A POS
ANTIBODY SCREEN: NEGATIVE

## 2016-06-04 LAB — HIV ANTIBODY (ROUTINE TESTING W REFLEX): HIV Screen 4th Generation wRfx: NONREACTIVE

## 2016-06-04 LAB — RPR: RPR: NONREACTIVE

## 2016-06-04 SURGERY — LIGATION, FALLOPIAN TUBE, POSTPARTUM
Anesthesia: Choice | Laterality: Bilateral

## 2016-06-04 MED ORDER — OXYTOCIN 40 UNITS IN LACTATED RINGERS INFUSION - SIMPLE MED
2.5000 [IU]/h | INTRAVENOUS | Status: DC
  Filled 2016-06-04: qty 1000

## 2016-06-04 MED ORDER — LIDOCAINE HCL (PF) 1 % IJ SOLN
30.0000 mL | INTRAMUSCULAR | Status: DC | PRN
  Filled 2016-06-04: qty 30

## 2016-06-04 MED ORDER — PRENATAL MULTIVITAMIN CH
1.0000 | ORAL_TABLET | Freq: Every day | ORAL | Status: DC
  Administered 2016-06-04 – 2016-06-05 (×2): 1 via ORAL
  Filled 2016-06-04 (×2): qty 1

## 2016-06-04 MED ORDER — COCONUT OIL OIL: 1.0000 "application " | TOPICAL_OIL | Status: DC | PRN

## 2016-06-04 MED ORDER — METOCLOPRAMIDE HCL 10 MG PO TABS: 10.0000 mg | ORAL_TABLET | Freq: Once | ORAL | Status: DC

## 2016-06-04 MED ORDER — ONDANSETRON HCL 4 MG/2ML IJ SOLN: 4.0000 mg | Freq: Four times a day (QID) | INTRAMUSCULAR | Status: DC | PRN

## 2016-06-04 MED ORDER — SOD CITRATE-CITRIC ACID 500-334 MG/5ML PO SOLN: 30.0000 mL | ORAL | Status: DC | PRN

## 2016-06-04 MED ORDER — ONDANSETRON HCL 4 MG/2ML IJ SOLN: 4.0000 mg | INTRAMUSCULAR | Status: DC | PRN

## 2016-06-04 MED ORDER — OXYCODONE-ACETAMINOPHEN 5-325 MG PO TABS
1.0000 | ORAL_TABLET | ORAL | Status: DC | PRN
  Administered 2016-06-04: 1 via ORAL
  Filled 2016-06-04: qty 1

## 2016-06-04 MED ORDER — FLEET ENEMA 7-19 GM/118ML RE ENEM: 1.0000 | ENEMA | RECTAL | Status: DC | PRN

## 2016-06-04 MED ORDER — FAMOTIDINE 20 MG PO TABS: 40.0000 mg | ORAL_TABLET | Freq: Once | ORAL | Status: DC

## 2016-06-04 MED ORDER — SENNOSIDES-DOCUSATE SODIUM 8.6-50 MG PO TABS
2.0000 | ORAL_TABLET | ORAL | Status: DC
Start: 2016-06-05 — End: 2016-06-05
  Administered 2016-06-05: 2 via ORAL
  Filled 2016-06-04: qty 2

## 2016-06-04 MED ORDER — ONDANSETRON HCL 4 MG PO TABS: 4.0000 mg | ORAL_TABLET | ORAL | Status: DC | PRN

## 2016-06-04 MED ORDER — DIPHENHYDRAMINE HCL 25 MG PO CAPS: 25.0000 mg | ORAL_CAPSULE | Freq: Four times a day (QID) | ORAL | Status: DC | PRN

## 2016-06-04 MED ORDER — TETANUS-DIPHTH-ACELL PERTUSSIS 5-2.5-18.5 LF-MCG/0.5 IM SUSP: 0.5000 mL | Freq: Once | INTRAMUSCULAR | Status: DC

## 2016-06-04 MED ORDER — BENZOCAINE-MENTHOL 20-0.5 % EX AERO: 1.0000 "application " | INHALATION_SPRAY | CUTANEOUS | Status: DC | PRN

## 2016-06-04 MED ORDER — OXYCODONE-ACETAMINOPHEN 5-325 MG PO TABS: 2.0000 | ORAL_TABLET | ORAL | Status: DC | PRN

## 2016-06-04 MED ORDER — LACTATED RINGERS IV SOLN: INTRAVENOUS | Status: DC

## 2016-06-04 MED ORDER — LACTATED RINGERS IV SOLN
INTRAVENOUS | Status: DC
  Administered 2016-06-04: 03:00:00 via INTRAVENOUS

## 2016-06-04 MED ORDER — ACETAMINOPHEN 325 MG PO TABS: 650.0000 mg | ORAL_TABLET | ORAL | Status: DC | PRN

## 2016-06-04 MED ORDER — WITCH HAZEL-GLYCERIN EX PADS: 1.0000 "application " | MEDICATED_PAD | CUTANEOUS | Status: DC | PRN

## 2016-06-04 MED ORDER — ZOLPIDEM TARTRATE 5 MG PO TABS: 5.0000 mg | ORAL_TABLET | Freq: Every evening | ORAL | Status: DC | PRN

## 2016-06-04 MED ORDER — DIBUCAINE 1 % RE OINT: 1.0000 "application " | TOPICAL_OINTMENT | RECTAL | Status: DC | PRN

## 2016-06-04 MED ORDER — OXYTOCIN BOLUS FROM INFUSION
500.0000 mL | Freq: Once | INTRAVENOUS | Status: AC
  Administered 2016-06-04: 500 mL via INTRAVENOUS

## 2016-06-04 MED ORDER — IBUPROFEN 600 MG PO TABS
600.0000 mg | ORAL_TABLET | Freq: Four times a day (QID) | ORAL | Status: DC
Start: 2016-06-04 — End: 2016-06-05
  Administered 2016-06-04 – 2016-06-05 (×6): 600 mg via ORAL
  Filled 2016-06-04 (×8): qty 1

## 2016-06-04 MED ORDER — LACTATED RINGERS IV SOLN: 500.0000 mL | INTRAVENOUS | Status: DC | PRN

## 2016-06-04 MED ORDER — SIMETHICONE 80 MG PO CHEW: 80.0000 mg | CHEWABLE_TABLET | ORAL | Status: DC | PRN

## 2016-06-04 NOTE — Lactation Note (Signed)
This note was copied from a baby's chart. Lactation Consultation Note  P4, Breastfed other children for approx 3 months.  Attended breastfeeding class. Family and baby resting. Suggest unwrapping baby and placing baby STS to interest baby in feeding. Suggest parents call for help if needed. Mom made aware of O/P services, breastfeeding support groups, community resources, and our phone # for post-discharge questions.    Patient Name: Elizabeth Hamilton [id number] Date: 06/04/2016 Reason for consult: Initial assessment   Maternal Data Has patient been taught Hand Expression?: Yes Does the patient have breastfeeding experience prior to this delivery?: Yes  Feeding    LATCH Score/Interventions                      Lactation Tools Discussed/Used     Consult Status Consult Status: Follow-up Date: 06/05/16 Follow-up type: In-patient    Vivianne Master [facility] 06/04/2016, 2:28 PM

## 2016-06-04 NOTE — H&P (Signed)
LABOR AND DELIVERY ADMISSION HISTORY AND PHYSICAL NOTE  Elizabeth Balog. is a 44 y.o. female [id number] with IUP at [redacted]w[redacted]d by LMP c/w 20wk [place] presenting for active labor. Pt was followed by Dr. Ruthann Cancer then was transferred to [facility].    Patient came in active labor, SROM with clear fluid, contracting every 2 min very strong and not unable to breathe through. Precipitously delivered via SVD.   Prenatal History/Complications:  Past Medical History: Past Medical History:  Diagnosis Date  . Medical history non-contributory     Past Surgical History: Past Surgical History:  Procedure Laterality Date  . NO PAST SURGERIES      Obstetrical History: OB History    Gravida Para Term Preterm AB Living   6 3 3   2 3    SAB TAB Ectopic Multiple Live Births   2     0 3      Social History: Social History   Social History  . Marital status: Married    Spouse name: N/A  . Number of children: N/A  . Years of education: N/A   Social History Main Topics  . Smoking status: Never Smoker  . Smokeless tobacco: Never Used  . Alcohol use No  . Drug use: No  . Sexual activity: Yes    Birth control/ protection: None   Other Topics Concern  . Not on file   Social History Narrative  . No narrative on file    Family History: Family History  Problem Relation Age of Onset  . Diabetes Mother   . Diabetes Father   . Parkinson's disease Father   . Alcohol abuse Neg Hx   . Arthritis Neg Hx   . Asthma Neg Hx   . Birth defects Neg Hx   . Cancer Neg Hx   . COPD Neg Hx   . Depression Neg Hx   . Drug abuse Neg Hx   . Early death Neg Hx   . Hearing loss Neg Hx   . Heart disease Neg Hx   . Hyperlipidemia Neg Hx   . Hypertension Neg Hx   . Kidney disease Neg Hx   . Learning disabilities Neg Hx   . Mental illness Neg Hx   . Mental retardation Neg Hx   . Miscarriages / Stillbirths Neg Hx   . Stroke Neg Hx   . Vision loss Neg Hx   . Varicose Veins Neg Hx     Allergies: No Known  Allergies  Prescriptions Prior to Admission  Medication Sig Dispense Refill Last Dose  . polyethylene glycol (MIRALAX) packet Take 17 g by mouth daily. (Patient not taking: Reported on 05/31/2016) 14 each 0 Not Taking  . Prenatal Vit-Fe Fumarate-FA (PRENATAL MULTIVITAMIN) TABS tablet Take 1 tablet by mouth daily at 12 noon.   Taking  . sodium phosphate (FLEET) 7-19 GM/118ML ENEM Place 133 mLs (1 enema total) rectally once. (Patient not taking: Reported on 05/15/2016) 1 Bottle 0 Not Taking     Review of Systems   All systems reviewed and negative except as stated in HPI  Last menstrual period 09/05/2015, unknown if currently breastfeeding. General appearance: alert and no distress Lungs: clear to auscultation bilaterally Heart: regular rate and rhythm Abdomen: soft, non-tender; bowel sounds normal Extremities: No calf swelling or tenderness Presentation: cephalic Fetal monitoring: 150's CatI Uterine activity: reg contractions     Prenatal labs: ABO, Rh: A/Positive/-- (01/19 0000) Antibody: Negative (01/19 0000) Rubella: !Error! RPR: Nonreactive (01/19 0000)  HBsAg: Negative (01/19  0000)  HIV: Non Reactive (07/12 1732)  GBS:   neg 1 hr Glucola: abnormal with normal 3 hour Genetic screening: abnormal quad screen; low risk panorama  Anatomy US:WNL  Prenatal Transfer Tool  Maternal Diabetes: No Genetic Screening: Normal Maternal Ultrasounds/Referrals: Normal Fetal Ultrasounds or other Referrals:  None Maternal Substance Abuse:  No Significant Maternal Medications:  None Significant Maternal Lab Results: None  No results found for this or any previous visit (from the past 24 hour(s)).  Patient Active Problem List   Diagnosis Date Noted  . Active labor at term 06/04/2016  . Supervision of high risk pregnancy in third trimester 05/03/2016  . Short interval between pregnancies affecting pregnancy in third trimester, antepartum 05/03/2016  . Abnormal glucose affecting  pregnancy 05/03/2016  . Abnormal maternal serum screening test 01/21/2016  . Maternal age 43+, multigravida, antepartum     Assessment: Elizabeth Schwanke. is a 45 y.o. [phone] at [redacted]w[redacted]d here for active labor.  Pt presented at 10cm and delivered quickly  #Labor: Active labor #Pain: None #ID:  GBS Neg #MOF: Breast #MOC: PPBTL #Circ:  N/A- girl  Pt s/p normal SVD.  She desires a PP BTL.    Patient desires surgical management with postpartum bilateral tubal ligation.  The risks of surgery were discussed in detail with the patient including but not limited to: bleeding which may require transfusion or reoperation; infection which may require prolonged hospitalization or re-hospitalization and antibiotic therapy; injury to bowel, bladder, ureters and major vessels or other surrounding organs; need for additional procedures including laparotomy; thromboembolic phenomenon, incisional problems and other postoperative or anesthesia complications. Failure rate of 3-01/999 quoted to pt.  Increased rate of ectopic pregnancy if pregnancy does occur.  Patient was told that the likelihood that her condition and symptoms will be treated effectively with this surgical management was very high; the postoperative expectations were also discussed in detail. The patient also understands the alternative treatment options which were discussed in full. All questions were answered.  She will be NPO.  Elizabeth Hamilton, M.D., Cherlynn June

## 2016-06-04 NOTE — MAU Note (Signed)
Pt presented to MAU very uncomfortable with contractions. Helped to change and into bed. Noticed fld on w/c and pt stated just started leaking. Clear fld. Dopplered FHR at 140. SVE 9cm. Sula Rumple RN called N. Deal RN in [facility] and report given. Dr Vanetta Shawl on MAU unit. Pt to 166 via stretcher and Dr Vanetta Shawl with pt for transfer to [facility] at [phone]

## 2016-06-04 NOTE — Progress Notes (Signed)
Faculty Practice OB/GYN Attending Note  I was called and notified that patient no longer wants to undergo PPBTS as scheduled. Husband will undergo vasectomy.  NPO order discontinued, regular diet ordered.  Will continue routine postpartum care.    Verita Schneiders, MD, [place] Attending [place], [facility]

## 2016-06-04 NOTE — Progress Notes (Signed)
Faculty Practice OB/GYN Attending Note  45 y.o. [id number] PPD#0 s/p SVD who desires permanent sterilization.  I met with patient and her husband, other reversible forms of contraception were discussed with patient; she declines all other modalities.  Also discussed possibility of vasectomy, her partner declined at this point.  Risks of procedure discussed with patient including but not limited to: risk of regret, permanence of method, bleeding, infection, injury to surrounding organs and need for additional procedures.  Failure risk of 1-2 % with increased risk of ectopic gestation if pregnancy occurs was also discussed with patient.  Patient verbalized understanding of these risks and wants to proceed with sterilization.  Written informed consent obtained.  To OR when ready.     Verita Schneiders, MD, [place] Attending [place], [facility]

## 2016-06-05 ENCOUNTER — Ambulatory Visit (HOSPITAL_COMMUNITY): Payer: 59

## 2016-06-05 MED ORDER — IBUPROFEN 600 MG PO TABS: 600.0000 mg | ORAL_TABLET | Freq: Four times a day (QID) | ORAL | 0 refills | Status: AC

## 2016-06-05 NOTE — Discharge Summary (Signed)
OB Discharge Summary     Patient Name: Elizabeth Hamilton DOB: [birth year] MRN: [id number]  Date of admission: 06/04/2016 Delivering MD: Lavonia Drafts   Date of discharge: 06/05/2016  Admitting diagnosis: 53 WEEKS IN LABOR Desire Sterilization Intrauterine pregnancy: [redacted]w[redacted]d     Secondary diagnosis:  Active Problems:   Maternal age 43+, multigravida, antepartum   Abnormal maternal serum screening test   Supervision of high risk pregnancy in third trimester   Short interval between pregnancies affecting pregnancy in third trimester, antepartum   Abnormal glucose affecting pregnancy   Pregnancy, supervision, high-risk  Additional problems: None     Discharge diagnosis: Term Pregnancy Delivered                                                                                                Post partum procedures:None  Augmentation: NOne  Complications: None  Hospital course:  Onset of Labor With Vaginal Delivery     45 y.o. yo [id number] at [redacted]w[redacted]d was admitted in Latent Labor on 06/04/2016. Patient had an uncomplicated labor course as follows:  Membrane Rupture Time/Date: 2:55 AM ,06/04/2016   Intrapartum Procedures: Episiotomy: None [1]                                         Lacerations:     Patient had a delivery of a Viable infant. 06/04/2016  Information for the patient's newborn:  Janaea, Starke [id number]  Delivery Method: Vag-Spont    Pateint had an uncomplicated postpartum course.  She is ambulating, tolerating a regular diet, passing flatus, and urinating well. Patient is discharged home in stable condition on 06/05/16.    Physical exam Vitals:   06/04/16 1130 06/04/16 1738 06/04/16 2100 06/05/16 0700  BP: 112/73 (!) 99/55  104/60  Pulse: 80 60  72  Resp: 18 18  18   Temp: 97.8 F (36.6 C) 97.9 F (36.6 C)  97.9 F (36.6 C)  TempSrc: Oral Oral  Oral  SpO2:      Weight:   170 lb (77.1 kg)   Height:   5\' 2"  (1.575 m)    General: alert, cooperative  and no distress Lochia: appropriate Uterine Fundus: firm Incision: N/A DVT Evaluation: Negative Homan's sign. Labs: Lab Results  Component Value Date   WBC 14.5 (H) 06/04/2016   HGB 11.1 (L) 06/04/2016   HCT 33.8 (L) 06/04/2016   MCV 84.3 06/04/2016   PLT 212 06/04/2016   CMP Latest Ref Rng & Units 02/11/2007  Glucose 70 - 99 mg/dL 108(H)    Discharge instruction: per After Visit Summary and "Baby and Me Booklet".  After visit meds:    Medication List    TAKE these medications   calcium carbonate 500 MG chewable tablet Commonly known as:  TUMS - dosed in mg elemental calcium Chew 2 tablets by mouth as needed for indigestion or heartburn.   ibuprofen 600 MG tablet Commonly known as:  ADVIL,MOTRIN Take 1 tablet (600 mg total) by mouth every 6 (six)  hours.   prenatal multivitamin Tabs tablet Take 1 tablet by mouth daily.       Diet: routine diet  Activity: Advance as tolerated. Pelvic rest for 6 weeks.   Outpatient follow up:6 weeks Follow up Appt: Follow-up Information    [facility] Follow up in 6 week(s).   Contact information: [place] [address] [phone] [place] .   Why:  as needed in emergencies Contact information: [address] [id number] [place] [phone]          Postpartum contraception: Vasectomy  Newborn Data: Live born female  Birth Weight: 6 lb 11.6 oz (3050 g) APGAR: 9, 10  Baby Feeding: Breast Disposition:home with mother   06/05/2016 Manya Silvas, CNM

## 2016-06-05 NOTE — Discharge Instructions (Signed)

## 2016-06-06 ENCOUNTER — Ambulatory Visit (HOSPITAL_COMMUNITY): Payer: 59

## 2016-06-07 ENCOUNTER — Encounter: Payer: Self-pay | Admitting: *Deleted

## 2016-06-07 ENCOUNTER — Encounter: Payer: 59 | Admitting: Obstetrics and Gynecology

## 2016-06-07 ENCOUNTER — Ambulatory Visit (HOSPITAL_COMMUNITY): Payer: 59

## 2016-06-08 ENCOUNTER — Ambulatory Visit (HOSPITAL_COMMUNITY): Payer: 59

## 2016-06-09 ENCOUNTER — Ambulatory Visit (HOSPITAL_COMMUNITY): Payer: 59

## 2016-06-09 ENCOUNTER — Other Ambulatory Visit (HOSPITAL_COMMUNITY): Payer: 59

## 2016-06-09 ENCOUNTER — Encounter (HOSPITAL_COMMUNITY): Payer: Self-pay | Admitting: *Deleted

## 2016-06-14 ENCOUNTER — Encounter: Payer: 59 | Admitting: Obstetrics and Gynecology

## 2016-06-21 ENCOUNTER — Ambulatory Visit (INDEPENDENT_AMBULATORY_CARE_PROVIDER_SITE_OTHER): Payer: 59 | Admitting: Obstetrics and Gynecology

## 2016-06-21 DIAGNOSIS — Z1389 Encounter for screening for other disorder: Secondary | ICD-10-CM | POA: Diagnosis not present

## 2016-06-21 LAB — POCT URINALYSIS DIPSTICK
Bilirubin, UA: NEGATIVE
GLUCOSE UA: NEGATIVE
KETONES UA: NEGATIVE
Nitrite, UA: NEGATIVE
PROTEIN UA: NEGATIVE
RBC UA: NEGATIVE
SPEC GRAV UA: 1.02
Urobilinogen, UA: 0.2
pH, UA: 5

## 2016-06-21 NOTE — Progress Notes (Signed)
Subjective:     Elizabeth Hamilton. is a 45 y.o. female who presents for a postpartum visit. She is 2 weeks postpartum following a spontaneous vaginal delivery. I have fully reviewed the prenatal and intrapartum course. The delivery was at 43 gestational weeks. Outcome: spontaneous vaginal delivery. Anesthesia: none. Postpartum course has been uncomplicated. Baby's course has been uncomplicated. Baby is feeding by breast. Bleeding thin lochia. Bowel function is normal. Bladder function is normal. Patient is not sexually active. Contraception method is abstinence. Postpartum depression screening: negative. Patient is receiving help from mother and husband     Review of Systems Pertinent items are noted in HPI.   Objective:    BP 127/78   Pulse 75   Temp 98.8 F (37.1 C) (Oral)   Wt 156 lb 8 oz (71 kg)   LMP 09/05/2015   BMI 28.62 kg/m   General:  alert, cooperative and no distress   Breasts:  inspection negative, no nipple discharge or bleeding, no masses or nodularity palpable        Assessment:     Normal 2 week  postpartum exam. Pap smear not done at today's visit.   Plan:    1. Contraception: abstinence 2. Patient is to continue current care. Husband plans on vasectomy 3. Follow up in: 4 weeks or as needed.

## 2016-06-21 NOTE — Addendum Note (Signed)
Addended by: Manuela Schwartz C on: 06/21/2016 04:59 PM   Modules accepted: Orders

## 2016-07-19 ENCOUNTER — Ambulatory Visit (INDEPENDENT_AMBULATORY_CARE_PROVIDER_SITE_OTHER): Payer: 59 | Admitting: Obstetrics and Gynecology

## 2016-07-19 DIAGNOSIS — O0993 Supervision of high risk pregnancy, unspecified, third trimester: Secondary | ICD-10-CM

## 2016-07-19 DIAGNOSIS — Z3009 Encounter for other general counseling and advice on contraception: Secondary | ICD-10-CM

## 2016-07-19 MED ORDER — MEDROXYPROGESTERONE ACETATE 150 MG/ML IM SUSP: 150.0000 mg | INTRAMUSCULAR | 5 refills | Status: DC

## 2016-07-19 NOTE — Progress Notes (Signed)
Subjective:     Elizabeth Hamilton. is a 45 y.o. female who presents for a postpartum visit. She is 6 weeks postpartum following a spontaneous vaginal delivery. I have fully reviewed the prenatal and intrapartum course. The delivery was at 36 gestational weeks. Outcome: spontaneous vaginal delivery. Anesthesia: none. Postpartum course has been uncomplicated. Baby's course has been uncomplicated. Baby is feeding by both breast and bottle - Similac Advance. Bleeding no bleeding. Bowel function is normal. Bladder function is normal. Patient is not sexually active. Contraception method is condoms. Postpartum depression screening: negative. Patient plans to return to work at the end of October     Review of Systems Pertinent items are noted in HPI.   Objective:    BP 104/69   Pulse 76   Temp 98.8 F (37.1 C) (Oral)   Wt 155 lb 3.2 oz (70.4 kg)   Breastfeeding? Yes   BMI 28.39 kg/m   General:  alert, cooperative and no distress   Breasts:  inspection negative, no nipple discharge or bleeding, no masses or nodularity palpable  Lungs: clear to auscultation bilaterally  Heart:  regular rate and rhythm  Abdomen: soft, non-tender; bowel sounds normal; no masses,  no organomegaly   Vulva:  normal  Vagina: normal vagina, no discharge, exudate, lesion, or erythema  Cervix:  multiparous appearance  Corpus: normal size, contour, position, consistency, mobility, non-tender  Adnexa:  normal adnexa and no mass, fullness, tenderness  Rectal Exam: Not performed.        Assessment:     Normal postpartum exam. Pap smear not done at today's visit.   Plan:    1. Contraception: condoms and Depo-Provera injections. Rx provided today. Patient will return for administration. Patient advised to use condoms for the next 2 weeks following initiation of depo-provera 2. Patient is medically cleared to resume all activities of daily living 3. Follow up in: 6 months for annual exam or as needed.

## 2016-07-19 NOTE — Progress Notes (Signed)
Patient is in the office for postpartum visit, and is considering depo injection.

## 2016-08-08 ENCOUNTER — Other Ambulatory Visit: Payer: Self-pay | Admitting: Family Medicine

## 2016-08-08 DIAGNOSIS — Z1231 Encounter for screening mammogram for malignant neoplasm of breast: Secondary | ICD-10-CM

## 2016-08-23 ENCOUNTER — Telehealth: Payer: Self-pay | Admitting: *Deleted

## 2016-08-23 NOTE — Telephone Encounter (Signed)
Patient called with questions about the Depo Provera. 3:00 LM on VM to CB

## 2016-08-29 ENCOUNTER — Ambulatory Visit
Admission: RE | Admit: 2016-08-29 | Discharge: 2016-08-29 | Disposition: A | Discharge status: Home / Self Care | Payer: 59 | Source: Ambulatory Visit | Attending: Family Medicine | Admitting: Family Medicine

## 2016-08-29 ENCOUNTER — Ambulatory Visit (INDEPENDENT_AMBULATORY_CARE_PROVIDER_SITE_OTHER): Payer: 59 | Admitting: *Deleted

## 2016-08-29 VITALS — BP 110/73 | HR 69 | Wt 147.0 lb

## 2016-08-29 DIAGNOSIS — Z30013 Encounter for initial prescription of injectable contraceptive: Secondary | ICD-10-CM | POA: Diagnosis not present

## 2016-08-29 DIAGNOSIS — Z3202 Encounter for pregnancy test, result negative: Secondary | ICD-10-CM

## 2016-08-29 DIAGNOSIS — Z1231 Encounter for screening mammogram for malignant neoplasm of breast: Secondary | ICD-10-CM

## 2016-08-29 LAB — POCT URINE PREGNANCY: PREG TEST UR: NEGATIVE

## 2016-08-29 MED ORDER — MEDROXYPROGESTERONE ACETATE 150 MG/ML IM SUSP
150.0000 mg | INTRAMUSCULAR | Status: AC
  Administered 2016-08-29 – 2017-02-14 (×2): 150 mg via INTRAMUSCULAR

## 2016-10-12 DIAGNOSIS — H5213 Myopia, bilateral: Secondary | ICD-10-CM | POA: Diagnosis not present

## 2016-11-14 ENCOUNTER — Ambulatory Visit: Payer: 59

## 2016-11-21 ENCOUNTER — Ambulatory Visit (INDEPENDENT_AMBULATORY_CARE_PROVIDER_SITE_OTHER): Payer: 59 | Admitting: *Deleted

## 2016-11-21 VITALS — BP 115/72 | HR 80 | Wt 147.1 lb

## 2016-11-21 DIAGNOSIS — Z3042 Encounter for surveillance of injectable contraceptive: Secondary | ICD-10-CM | POA: Diagnosis not present

## 2016-11-21 MED ORDER — MEDROXYPROGESTERONE ACETATE 150 MG/ML IM SUSP
150.0000 mg | Freq: Once | INTRAMUSCULAR | Status: AC
  Administered 2016-11-21: 150 mg via INTRAMUSCULAR

## 2017-02-14 ENCOUNTER — Ambulatory Visit (INDEPENDENT_AMBULATORY_CARE_PROVIDER_SITE_OTHER): Payer: 59

## 2017-02-14 VITALS — Wt 147.0 lb

## 2017-02-14 DIAGNOSIS — Z3042 Encounter for surveillance of injectable contraceptive: Secondary | ICD-10-CM

## 2017-02-14 NOTE — Progress Notes (Signed)
Patient is in the office for depo injection, administered and pt tolerated well .Marland Kitchen Administrations This Visit    medroxyPROGESTERone (DEPO-PROVERA) injection 150 mg    Admin Date 02/14/2017 Action Given Dose 150 mg Route Intramuscular Administered By Hinton Lovely, RN

## 2017-05-09 ENCOUNTER — Ambulatory Visit: Payer: 59

## 2017-05-10 ENCOUNTER — Ambulatory Visit (INDEPENDENT_AMBULATORY_CARE_PROVIDER_SITE_OTHER): Payer: 59

## 2017-05-10 DIAGNOSIS — Z3042 Encounter for surveillance of injectable contraceptive: Secondary | ICD-10-CM

## 2017-05-10 MED ORDER — MEDROXYPROGESTERONE ACETATE 150 MG/ML IM SUSP
150.0000 mg | Freq: Once | INTRAMUSCULAR | Status: AC
  Administered 2017-05-10: 150 mg via INTRAMUSCULAR

## 2017-05-10 NOTE — Progress Notes (Signed)
Nurse visit for pt supplied Depo given given on time L upper outer quad w/o difficulty. Next depo due 10/4 pt agrees.

## 2017-07-25 ENCOUNTER — Other Ambulatory Visit: Payer: Self-pay | Admitting: Obstetrics and Gynecology

## 2017-07-26 ENCOUNTER — Ambulatory Visit: Payer: 59

## 2017-07-26 ENCOUNTER — Telehealth: Payer: Self-pay

## 2017-07-26 NOTE — Telephone Encounter (Signed)
Patient called in wanting depo refills, advised that she is due for an appt, transferred to scheduler.

## 2017-08-07 ENCOUNTER — Ambulatory Visit: Payer: Self-pay | Admitting: Obstetrics

## 2017-08-08 ENCOUNTER — Telehealth: Payer: Self-pay | Admitting: *Deleted

## 2017-08-08 NOTE — Telephone Encounter (Signed)
Lft vmail for patient to call back and reschedule or to let us know her plans.Marland KitchenMarland Kitchen

## 2021-08-30 DIAGNOSIS — Z3042 Encounter for surveillance of injectable contraceptive: Secondary | ICD-10-CM | POA: Diagnosis not present

## 2021-10-21 DIAGNOSIS — M25561 Pain in right knee: Secondary | ICD-10-CM | POA: Diagnosis not present

## 2021-10-21 DIAGNOSIS — Z1211 Encounter for screening for malignant neoplasm of colon: Secondary | ICD-10-CM | POA: Diagnosis not present

## 2021-10-21 DIAGNOSIS — Z13220 Encounter for screening for lipoid disorders: Secondary | ICD-10-CM | POA: Diagnosis not present

## 2021-10-21 DIAGNOSIS — Z833 Family history of diabetes mellitus: Secondary | ICD-10-CM | POA: Diagnosis not present

## 2021-10-21 DIAGNOSIS — Z0000 Encounter for general adult medical examination without abnormal findings: Secondary | ICD-10-CM | POA: Diagnosis not present

## 2021-10-21 DIAGNOSIS — Z1212 Encounter for screening for malignant neoplasm of rectum: Secondary | ICD-10-CM | POA: Diagnosis not present

## 2021-10-21 DIAGNOSIS — Z1231 Encounter for screening mammogram for malignant neoplasm of breast: Secondary | ICD-10-CM | POA: Diagnosis not present

## 2021-12-02 DIAGNOSIS — Z3042 Encounter for surveillance of injectable contraceptive: Secondary | ICD-10-CM | POA: Diagnosis not present

## 2021-12-02 DIAGNOSIS — Z3202 Encounter for pregnancy test, result negative: Secondary | ICD-10-CM | POA: Diagnosis not present

## 2022-02-24 DIAGNOSIS — Z3202 Encounter for pregnancy test, result negative: Secondary | ICD-10-CM | POA: Diagnosis not present

## 2022-02-24 DIAGNOSIS — Z3042 Encounter for surveillance of injectable contraceptive: Secondary | ICD-10-CM | POA: Diagnosis not present

## 2022-02-24 DIAGNOSIS — Z01419 Encounter for gynecological examination (general) (routine) without abnormal findings: Secondary | ICD-10-CM | POA: Diagnosis not present

## 2022-05-18 DIAGNOSIS — Z3042 Encounter for surveillance of injectable contraceptive: Secondary | ICD-10-CM | POA: Diagnosis not present

## 2022-08-09 DIAGNOSIS — Z3042 Encounter for surveillance of injectable contraceptive: Secondary | ICD-10-CM | POA: Diagnosis not present
